# Patient Record
Sex: Male | Born: 1995 | Smoking: Current some day smoker
Health system: Western US, Academic
[De-identification: ages and names within clinical notes are randomized; demographics above are authoritative.]

## PROBLEM LIST (undated history)

## (undated) DIAGNOSIS — T7840XA Allergy, unspecified, initial encounter: Secondary | ICD-10-CM

## (undated) DIAGNOSIS — L209 Atopic dermatitis, unspecified: Secondary | ICD-10-CM

## (undated) DIAGNOSIS — J329 Chronic sinusitis, unspecified: Secondary | ICD-10-CM

## (undated) DIAGNOSIS — L210 Seborrhea capitis: Secondary | ICD-10-CM

## (undated) DIAGNOSIS — D573 Sickle-cell trait: Secondary | ICD-10-CM

## (undated) HISTORY — PX: PR OPEN TX RADIOCARPAL/INTERCARPAL DISLC 1/> BONES: 25670

## (undated) HISTORY — DX: Allergy, unspecified, initial encounter: T78.40XA

---
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
## 2017-11-03 ENCOUNTER — Encounter: Payer: Self-pay | Admitting: Family Medicine

## 2017-11-03 ENCOUNTER — Other Ambulatory Visit (HOSPITAL_COMMUNITY)
Admission: RE | Admit: 2017-11-03 | Discharge: 2017-11-03 | Disposition: A | Discharge status: Home / Self Care | Payer: BLUE CROSS/BLUE SHIELD | Source: Ambulatory Visit | Attending: Family Medicine | Admitting: Family Medicine

## 2017-11-03 ENCOUNTER — Ambulatory Visit (INDEPENDENT_AMBULATORY_CARE_PROVIDER_SITE_OTHER): Payer: Self-pay | Admitting: Family Medicine

## 2017-11-03 VITALS — BP 122/76 | HR 76 | Temp 98.0°F | Resp 16 | Ht 76.5 in | Wt 186.0 lb

## 2017-11-03 DIAGNOSIS — R319 Hematuria, unspecified: Secondary | ICD-10-CM

## 2017-11-03 DIAGNOSIS — R748 Abnormal levels of other serum enzymes: Secondary | ICD-10-CM

## 2017-11-03 LAB — POCT URINALYSIS DIPSTICK
BILIRUBIN UA: NEGATIVE
Glucose, UA: NEGATIVE
Ketones, UA: NEGATIVE
Leukocytes, UA: NEGATIVE
NITRITE UA: NEGATIVE
PH UA: 6 (ref 5.0–8.0)
Protein, UA: NEGATIVE
Spec Grav, UA: >=1.03 — AB (ref 1.010–1.025)
Urobilinogen, UA: 0.2 E.U./dL

## 2017-11-03 NOTE — Progress Notes (Signed)
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  He states that is sees blood in his urine after exercise.  He has had this problem for about 5 months.  His PCP at home told him it was due to dehydration and elevated CK levels.  He was told that he needed to stay well hydrated and he also wanted him to rest for 2 weeks.  The patient reports that he had his college season and then was drafted and has been unable to rest.  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  He is sexually active,monogamous. PMH only for AR. FH unremarkable. Mild gross hematuria since February with no associated discharge or dysuria. Past Medical History:  Diagnosis Date  . Allergy     Social History   Socioeconomic History  . Marital status: Single    Spouse name: Not on file  . Number of children: 0  . Years of education: Not on file  . Highest education level: Some college, no degree  Occupational History  . Occupation: Print production planner    Comment: [employer]  Social Needs  . Financial resource strain: Not on file  . Food insecurity:    Worry: Not on file    Inability: Not on file  . Transportation needs:    Medical: Not on file    Non-medical: Not on file  Tobacco Use  . Smoking status: Never Smoker  Substance and Sexual Activity  . Alcohol use: Yes    Alcohol/week: 1.8 oz    Types: 3 Standard drinks or equivalent per week  . Drug use: Never  . Sexual activity: Yes    Birth control/protection: Condom  Lifestyle  . Physical activity:    Days per week: Not on file    Minutes per session: Not on file  . Stress: Not on file  Relationships  . Social connections:    Talks on phone: Not on file    Gets together: Not on file    Attends religious service: Not on file    Active member of club or organization: Not on file    Attends meetings of  clubs or organizations: Not on file    Relationship status: Not on file  . Intimate partner violence:    Fear of current or ex partner: Not on file    Emotionally abused: Not on file    Physically abused: Not on file    Forced sexual activity: Not on file  Other Topics Concern  . Not on file  Social History Narrative  . Not on file    No outpatient encounter medications on file as of 11/03/2017.   No facility-administered encounter medications on file as of 11/03/2017.     No Known Allergies  Review of Systems  Constitutional: Negative.   HENT: Negative.   Eyes: Negative.   Respiratory: Negative.   Cardiovascular: Negative.   Gastrointestinal: Negative.   Genitourinary: Positive for hematuria.  Musculoskeletal: Negative.   Skin: Negative.   Neurological: Negative.   Endo/Heme/Allergies: Negative.   Psychiatric/Behavioral: Negative.     Objective:  BP 122/76 (BP Location: Right Arm, Patient Position: Sitting, Cuff Size: Normal)   Pulse 76   Temp 98 F (36.7 C) (Oral)   Resp 16   Ht 6' 4.5" (1.943 m)   Wt 186 lb (84.4 kg)  BMI 22.35 kg/m   Physical Exam  Constitutional: He is oriented to person, place, and time and well-developed, well-nourished, and in no distress.  HENT:  Head: Normocephalic and atraumatic.  Eyes: Conjunctivae are normal. No scleral icterus.  Cardiovascular: Normal rate, regular rhythm and normal heart sounds.  Pulmonary/Chest: Effort normal and breath sounds normal.  Abdominal: Soft.  Genitourinary: Penis normal.  Musculoskeletal: He exhibits no edema.  Lymphadenopathy:    He has no cervical adenopathy.  Neurological: He is alert and oriented to person, place, and time. Gait normal. GCS score is 15.  Skin: Skin is warm and dry.  Psychiatric: Mood, memory, affect and judgment normal.    Assessment and Plan :  Hematuria, unspecified type - Plan: POCT urinalysis dipstick Chlamydia/GC/labs. Likely benign but persistant. Refer to  Urology.  I have done the exam and reviewed the chart and it is accurate to the best of my knowledge. DentistDragon  technology has been used and  any errors in dictation or transcription are unintentional. Julieanne Mansonichard Khristian Seals M.D. [facility]

## 2017-11-04 LAB — COMPREHENSIVE METABOLIC PANEL
A/G RATIO: 1.5 (ref 1.2–2.2)
ALK PHOS: 72 IU/L (ref 39–117)
ALT: 12 IU/L (ref 0–44)
AST: 23 IU/L (ref 0–40)
Albumin: 4.3 g/dL (ref 3.5–5.5)
BUN/Creatinine Ratio: 9 (ref 9–20)
BUN: 12 mg/dL (ref 6–20)
Bilirubin Total: 0.3 mg/dL (ref 0.0–1.2)
CO2: 23 mmol/L (ref 20–29)
CREATININE: 1.35 mg/dL — AB (ref 0.76–1.27)
Calcium: 9.1 mg/dL (ref 8.7–10.2)
Chloride: 105 mmol/L (ref 96–106)
GFR calc Af Amer: 87 mL/min/{1.73_m2} (ref >59)
GFR calc non Af Amer: 75 mL/min/{1.73_m2} (ref >59)
GLOBULIN, TOTAL: 2.9 g/dL (ref 1.5–4.5)
Glucose: 61 mg/dL — ABNORMAL LOW (ref 65–99)
POTASSIUM: 4.2 mmol/L (ref 3.5–5.2)
SODIUM: 142 mmol/L (ref 134–144)
Total Protein: 7.2 g/dL (ref 6.0–8.5)

## 2017-11-04 LAB — URINE CYTOLOGY ANCILLARY ONLY
CHLAMYDIA, DNA PROBE: NEGATIVE
NEISSERIA GONORRHEA: NEGATIVE

## 2017-11-04 LAB — CBC WITH DIFFERENTIAL/PLATELET
BASOS ABS: 0.1 10*3/uL (ref 0.0–0.2)
Basos: 2 %
EOS (ABSOLUTE): 0.2 10*3/uL (ref 0.0–0.4)
Eos: 7 %
Hematocrit: 39.8 % (ref 37.5–51.0)
Hemoglobin: 13.5 g/dL (ref 13.0–17.7)
Immature Grans (Abs): 0 10*3/uL (ref 0.0–0.1)
Immature Granulocytes: 0 %
LYMPHS ABS: 1.7 10*3/uL (ref 0.7–3.1)
Lymphs: 49 %
MCH: 25.7 pg — AB (ref 26.6–33.0)
MCHC: 33.9 g/dL (ref 31.5–35.7)
MCV: 76 fL — AB (ref 79–97)
MONOCYTES: 12 %
MONOS ABS: 0.4 10*3/uL (ref 0.1–0.9)
NEUTROS ABS: 1 10*3/uL — AB (ref 1.4–7.0)
Neutrophils: 30 %
PLATELETS: 235 10*3/uL (ref 150–450)
RBC: 5.25 x10E6/uL (ref 4.14–5.80)
RDW: 14.3 % (ref 12.3–15.4)
WBC: 3.3 10*3/uL — AB (ref 3.4–10.8)

## 2017-11-04 LAB — CK: Total CK: 583 U/L (ref 24–204)

## 2017-11-18 ENCOUNTER — Ambulatory Visit (INDEPENDENT_AMBULATORY_CARE_PROVIDER_SITE_OTHER): Payer: BLUE CROSS/BLUE SHIELD

## 2017-11-18 DIAGNOSIS — Z23 Encounter for immunization: Secondary | ICD-10-CM | POA: Diagnosis not present

## 2017-11-27 ENCOUNTER — Ambulatory Visit (INDEPENDENT_AMBULATORY_CARE_PROVIDER_SITE_OTHER): Payer: BLUE CROSS/BLUE SHIELD | Admitting: Family Medicine

## 2017-11-27 VITALS — BP 118/76 | HR 58 | Temp 98.1°F | Resp 16 | Wt 185.0 lb

## 2017-11-27 DIAGNOSIS — G729 Myopathy, unspecified: Secondary | ICD-10-CM

## 2017-11-27 DIAGNOSIS — D649 Anemia, unspecified: Secondary | ICD-10-CM | POA: Diagnosis not present

## 2017-11-27 NOTE — Progress Notes (Signed)
George Wilkinson  MRN: [id number] DOB: 22-Nov-1995  Subjective:  HPI   Patient is a 22 year old male that presents for follow up of his elevated CK He feels well. No c/o. His mother does have sickle cell trait. There are no active problems to display for this patient.   Past Medical History:  Diagnosis Date  . Allergy     Social History   Socioeconomic History  . Marital status: Single    Spouse name: Not on file  . Number of children: 0  . Years of education: Not on file  . Highest education level: Some college, no degree  Occupational History  . Occupation: Print production planner    Comment: [employer]  Social Needs  . Financial resource strain: Not on file  . Food insecurity:    Worry: Not on file    Inability: Not on file  . Transportation needs:    Medical: Not on file    Non-medical: Not on file  Tobacco Use  . Smoking status: Never Smoker  Substance and Sexual Activity  . Alcohol use: Yes    Alcohol/week: 1.8 oz    Types: 3 Standard drinks or equivalent per week  . Drug use: Never  . Sexual activity: Yes    Birth control/protection: Condom  Lifestyle  . Physical activity:    Days per week: Not on file    Minutes per session: Not on file  . Stress: Not on file  Relationships  . Social connections:    Talks on phone: Not on file    Gets together: Not on file    Attends religious service: Not on file    Active member of club or organization: Not on file    Attends meetings of clubs or organizations: Not on file    Relationship status: Not on file  . Intimate partner violence:    Fear of current or ex partner: Not on file    Emotionally abused: Not on file    Physically abused: Not on file    Forced sexual activity: Not on file  Other Topics Concern  . Not on file  Social History Narrative  . Not on file    No outpatient encounter medications on file as of 11/27/2017.   No facility-administered encounter medications on file as of 11/27/2017.      No Known Allergies  Review of Systems  Constitutional: Negative.   Eyes: Negative.   Respiratory: Negative.   Cardiovascular: Negative.   Gastrointestinal: Negative.   Musculoskeletal: Negative for back pain, joint pain, myalgias and neck pain.  Skin: Negative.   Neurological: Negative.   Endo/Heme/Allergies: Negative.   Psychiatric/Behavioral: Negative.     Objective:  BP 118/76 (BP Location: Right Arm, Patient Position: Sitting, Cuff Size: Normal)   Pulse (!) 58   Temp 98.1 F (36.7 C) (Oral)   Resp 16   Wt 185 lb (83.9 kg)   BMI 22.23 kg/m   Physical Exam  Constitutional: He is oriented to person, place, and time.  HENT:  Head: Normocephalic.  Right Ear: External ear normal.  Left Ear: External ear normal.  Nose: Nose normal.  Eyes: Conjunctivae are normal. No scleral icterus.  Neck: No thyromegaly present.  Cardiovascular: Normal rate, regular rhythm and normal heart sounds.  Pulmonary/Chest: Effort normal and breath sounds normal.  Neurological: He is alert and oriented to person, place, and time. Gait normal. GCS score is 15.  Skin: Skin is warm and dry.  Psychiatric: Mood,  memory, affect and judgment normal.    Assessment and Plan :  1. Myopathy  - Iron - CBC with Differential/Platelet - Urinalysis, Routine w reflex microscopic; Future - Urinalysis, Routine w reflex microscopic  2. Anemia, unspecified type  - CK - ANA - Sedimentation rate - Iron - CBC with Differential/Platelet - Aldolase - Urinalysis, Routine w reflex microscopic; Future - Urinalysis, Routine w reflex microscopic  I have done the exam and reviewed the chart and it is accurate to the best of my knowledge. DentistDragon  technology has been used and  any errors in dictation or transcription are unintentional. Julieanne Mansonichard Gaylene Moylan M.D. [facility]

## 2017-11-28 ENCOUNTER — Telehealth: Payer: Self-pay

## 2017-11-28 LAB — URINALYSIS, ROUTINE W REFLEX MICROSCOPIC
BILIRUBIN UA: NEGATIVE
Glucose, UA: NEGATIVE
Ketones, UA: NEGATIVE
Nitrite, UA: NEGATIVE
PH UA: 7 (ref 5.0–7.5)
Specific Gravity, UA: 1.021 (ref 1.005–1.030)
UUROB: 0.2 mg/dL (ref 0.2–1.0)

## 2017-11-28 LAB — CBC WITH DIFFERENTIAL/PLATELET
BASOS: 1 %
Basophils Absolute: 0 10*3/uL (ref 0.0–0.2)
EOS (ABSOLUTE): 0.3 10*3/uL (ref 0.0–0.4)
EOS: 10 %
HEMATOCRIT: 40.4 % (ref 37.5–51.0)
Hemoglobin: 12.7 g/dL — ABNORMAL LOW (ref 13.0–17.7)
IMMATURE GRANS (ABS): 0 10*3/uL (ref 0.0–0.1)
IMMATURE GRANULOCYTES: 0 %
Lymphocytes Absolute: 1.3 10*3/uL (ref 0.7–3.1)
Lymphs: 42 %
MCH: 24.2 pg — AB (ref 26.6–33.0)
MCHC: 31.4 g/dL — ABNORMAL LOW (ref 31.5–35.7)
MCV: 77 fL — AB (ref 79–97)
MONOS ABS: 0.3 10*3/uL (ref 0.1–0.9)
Monocytes: 9 %
NEUTROS ABS: 1.1 10*3/uL — AB (ref 1.4–7.0)
Neutrophils: 38 %
PLATELETS: 257 10*3/uL (ref 150–450)
RBC: 5.25 x10E6/uL (ref 4.14–5.80)
RDW: 12.9 % (ref 12.3–15.4)
WBC: 3 10*3/uL — ABNORMAL LOW (ref 3.4–10.8)

## 2017-11-28 LAB — MICROSCOPIC EXAMINATION
CASTS: NONE SEEN /LPF
EPITHELIAL CELLS (NON RENAL): NONE SEEN /HPF (ref 0–10)
RBC, UA: >30 /hpf — AB (ref 0–2)

## 2017-11-28 LAB — IRON: Iron: 52 ug/dL (ref 38–169)

## 2017-11-28 LAB — ALDOLASE: Aldolase: 8.9 U/L (ref 3.3–10.3)

## 2017-11-28 LAB — ANA: ANA: NEGATIVE

## 2017-11-28 LAB — SEDIMENTATION RATE: SED RATE: 2 mm/h (ref 0–15)

## 2017-11-28 LAB — CK: CK TOTAL: 348 U/L — AB (ref 24–204)

## 2017-11-28 NOTE — Progress Notes (Signed)
Advised of results.  Trainer relayed that the patient was tested in [place] and was positive for infection.  He does not know if he was treated or it the infection was urine, GC or Chlamydia.  He will try to get those results and in the meantime he will plan on coming in on Monday when they get back in town.   [name] was advised to watch for signs that may indication worsening infection such as fever, increased blood in the urine, back pain and abdominal or testicular pain.

## 2017-11-29 NOTE — Telephone Encounter (Signed)
Spoke with trainer regarding lab results.  Will have patient f/u labs when back in town

## 2017-12-01 ENCOUNTER — Ambulatory Visit: Payer: Self-pay | Admitting: Family Medicine

## 2017-12-03 ENCOUNTER — Other Ambulatory Visit: Payer: Self-pay

## 2017-12-03 DIAGNOSIS — R319 Hematuria, unspecified: Secondary | ICD-10-CM

## 2017-12-04 LAB — URINALYSIS, ROUTINE W REFLEX MICROSCOPIC
BILIRUBIN UA: NEGATIVE
Glucose, UA: NEGATIVE
KETONES UA: NEGATIVE
Nitrite, UA: NEGATIVE
PH UA: 5.5 (ref 5.0–7.5)
SPEC GRAV UA: 1.024 (ref 1.005–1.030)
Urobilinogen, Ur: 0.2 mg/dL (ref 0.2–1.0)

## 2017-12-04 LAB — MICROSCOPIC EXAMINATION
CASTS: NONE SEEN /LPF
EPITHELIAL CELLS (NON RENAL): NONE SEEN /HPF (ref 0–10)
RBC, UA: >30 /hpf — AB (ref 0–2)

## 2017-12-04 LAB — GC/CHLAMYDIA PROBE AMP
CHLAMYDIA, DNA PROBE: NEGATIVE
Neisseria gonorrhoeae by PCR: NEGATIVE

## 2017-12-05 ENCOUNTER — Telehealth: Payer: Self-pay

## 2017-12-05 ENCOUNTER — Other Ambulatory Visit: Payer: Self-pay

## 2017-12-05 DIAGNOSIS — R310 Gross hematuria: Secondary | ICD-10-CM

## 2017-12-05 NOTE — Telephone Encounter (Signed)
Advised 

## 2017-12-05 NOTE — Telephone Encounter (Signed)
-----   Message from Maple Hudsonichard L Gilbert Jr., MD sent at 12/04/2017  4:52 PM EDT ----- GC/Chlamydia negative--refer to urology. thanks

## 2017-12-05 NOTE — Telephone Encounter (Signed)
Ref for urology ordered. Will call brad and let him know.  Will call Maralyn SagoSarah and let her know about travel schedule    ----- Message from Maple Hudsonichard L Gilbert Jr., MD sent at 12/04/2017  4:52 PM EDT ----- GC/Chlamydia negative--refer to urology. thanks

## 2018-01-04 ENCOUNTER — Other Ambulatory Visit: Payer: Self-pay | Admitting: Orthopedic Surgery

## 2018-01-04 DIAGNOSIS — S62002P Unspecified fracture of navicular [scaphoid] bone of left wrist, subsequent encounter for fracture with malunion: Secondary | ICD-10-CM

## 2018-01-05 ENCOUNTER — Other Ambulatory Visit: Payer: PRIVATE HEALTH INSURANCE

## 2018-01-06 ENCOUNTER — Other Ambulatory Visit: Payer: Self-pay | Admitting: Orthopedic Surgery

## 2018-01-06 ENCOUNTER — Encounter: Payer: Self-pay | Admitting: Urology

## 2018-01-06 ENCOUNTER — Encounter

## 2018-01-06 ENCOUNTER — Ambulatory Visit: Payer: BLUE CROSS/BLUE SHIELD | Admitting: Urology

## 2018-01-06 DIAGNOSIS — S62002P Unspecified fracture of navicular [scaphoid] bone of left wrist, subsequent encounter for fracture with malunion: Secondary | ICD-10-CM

## 2018-01-07 ENCOUNTER — Encounter: Admit: 2018-01-07 | Discharge: 2018-01-08 | Payer: BC Managed Care – PPO

## 2018-01-07 ENCOUNTER — Ambulatory Visit
Admission: RE | Admit: 2018-01-07 | Discharge: 2018-01-07 | Disposition: A | Discharge status: Home / Self Care | Payer: BLUE CROSS/BLUE SHIELD | Source: Ambulatory Visit | Attending: Orthopedic Surgery | Admitting: Orthopedic Surgery

## 2018-01-07 DIAGNOSIS — S62002P Unspecified fracture of navicular [scaphoid] bone of left wrist, subsequent encounter for fracture with malunion: Secondary | ICD-10-CM

## 2018-01-07 DIAGNOSIS — S62014G Nondisplaced fracture of distal pole of navicular [scaphoid] bone of right wrist, subsequent encounter for fracture with delayed healing: Secondary | ICD-10-CM | POA: Insufficient documentation

## 2018-01-08 ENCOUNTER — Encounter: Admit: 2018-01-08 | Discharge: 2018-01-08 | Payer: BC Managed Care – PPO

## 2018-01-08 ENCOUNTER — Telehealth: Payer: Self-pay | Admitting: Family Medicine

## 2018-01-08 DIAGNOSIS — M25531 Pain in right wrist: Principal | ICD-10-CM

## 2018-01-08 NOTE — Telephone Encounter (Signed)
FYI--Pt was a no show for urology referral.Pt was sent for R31.9 (ICD-10-CM) - Hematuria, unspecified type R74.8 (ICD-10-CM) - Elevated CK

## 2018-01-09 ENCOUNTER — Ambulatory Visit: Admit: 2018-01-09 | Discharge: 2018-01-10 | Payer: BC Managed Care – PPO

## 2018-01-09 ENCOUNTER — Encounter: Admit: 2018-01-09 | Discharge: 2018-01-09 | Payer: BC Managed Care – PPO

## 2018-01-09 ENCOUNTER — Ambulatory Visit: Admit: 2018-01-09 | Discharge: 2018-01-09 | Payer: BC Managed Care – PPO

## 2018-01-09 ENCOUNTER — Ambulatory Visit: Payer: PRIVATE HEALTH INSURANCE

## 2018-01-09 DIAGNOSIS — S62001K Unspecified fracture of navicular [scaphoid] bone of right wrist, subsequent encounter for fracture with nonunion: Principal | ICD-10-CM

## 2018-01-09 DIAGNOSIS — M25531 Pain in right wrist: Principal | ICD-10-CM

## 2018-01-09 NOTE — Telephone Encounter (Signed)
Please review. Thanks!  

## 2018-01-10 DIAGNOSIS — S62001K Unspecified fracture of navicular [scaphoid] bone of right wrist, subsequent encounter for fracture with nonunion: Principal | ICD-10-CM

## 2018-01-12 ENCOUNTER — Ambulatory Visit: Admit: 2018-01-12 | Discharge: 2018-01-12 | Payer: BC Managed Care – PPO

## 2018-01-12 DIAGNOSIS — S62031K Displaced fracture of proximal third of navicular [scaphoid] bone of right wrist, subsequent encounter for fracture with nonunion: Principal | ICD-10-CM

## 2018-01-13 ENCOUNTER — Ambulatory Visit: Admit: 2018-01-13 | Discharge: 2018-01-13 | Payer: BC Managed Care – PPO

## 2018-01-13 ENCOUNTER — Encounter: Admit: 2018-01-13 | Discharge: 2018-01-13 | Payer: BC Managed Care – PPO

## 2018-01-13 DIAGNOSIS — I96 Gangrene, not elsewhere classified: ICD-10-CM

## 2018-01-13 DIAGNOSIS — S62031K Displaced fracture of proximal third of navicular [scaphoid] bone of right wrist, subsequent encounter for fracture with nonunion: Principal | ICD-10-CM

## 2018-01-13 MED ORDER — FENTANYL CITRATE (PF) 50 MCG/ML IJ SOLN
0 refills | Status: DC
Start: 2018-01-13 — End: 2018-01-13
  Administered 2018-01-13 (×2): 50 ug via INTRAVENOUS

## 2018-01-13 MED ORDER — HYDROMORPHONE (PF) 2 MG/ML IJ SYRG
.5 mg | INTRAVENOUS | 0 refills | Status: DC | PRN
Start: 2018-01-13 — End: 2018-01-13
  Administered 2018-01-13 (×3): 0.5 mg via INTRAVENOUS

## 2018-01-13 MED ORDER — METOCLOPRAMIDE HCL 5 MG/ML IJ SOLN
10 mg | Freq: Once | INTRAVENOUS | 0 refills | Status: DC | PRN
Start: 2018-01-13 — End: 2018-01-13

## 2018-01-13 MED ORDER — LIDOCAINE (PF) 10 MG/ML (1 %) IJ SOLN
.1-2 mL | INTRAMUSCULAR | 0 refills | Status: DC | PRN
Start: 2018-01-13 — End: 2018-01-13

## 2018-01-13 MED ORDER — OXYCODONE 5 MG PO TAB
5-10 mg | Freq: Once | ORAL | 0 refills | Status: CP | PRN
Start: 2018-01-13 — End: ?
  Administered 2018-01-13: 17:00:00 10 mg via ORAL

## 2018-01-13 MED ORDER — CEFAZOLIN 1 GRAM IJ SOLR
0 refills | Status: DC
Start: 2018-01-13 — End: 2018-01-13
  Administered 2018-01-13: 13:00:00 2 g via INTRAVENOUS

## 2018-01-13 MED ORDER — DEXMEDETOMIDINE# 4MCG/ML IV SOLN
0 refills | Status: DC
Start: 2018-01-13 — End: 2018-01-13
  Administered 2018-01-13 (×2): 4 ug via INTRAVENOUS

## 2018-01-13 MED ORDER — DEXAMETHASONE SODIUM PHOSPHATE 4 MG/ML IJ SOLN
INTRAVENOUS | 0 refills | Status: DC
Start: 2018-01-13 — End: 2018-01-13
  Administered 2018-01-13: 13:00:00 4 mg via INTRAVENOUS

## 2018-01-13 MED ORDER — FENTANYL CITRATE (PF) 50 MCG/ML IJ SOLN
0 refills | Status: DC
Start: 2018-01-13 — End: 2018-01-13

## 2018-01-13 MED ORDER — ROPIVACAINE (PF) 2 MG/ML (0.2 %) IJ SOLN
0 refills | Status: DC
Start: 2018-01-13 — End: 2018-01-13
  Administered 2018-01-13: 16:00:00 10 mL via INTRAMUSCULAR

## 2018-01-13 MED ORDER — LACTATED RINGERS IV SOLP
INTRAVENOUS | 0 refills | Status: DC
Start: 2018-01-13 — End: 2018-01-13
  Administered 2018-01-13: 16:00:00 1000.000 mL via INTRAVENOUS

## 2018-01-13 MED ORDER — MIDAZOLAM 1 MG/ML IJ SOLN
INTRAVENOUS | 0 refills | Status: DC
Start: 2018-01-13 — End: 2018-01-13
  Administered 2018-01-13: 13:00:00 2 mg via INTRAVENOUS

## 2018-01-13 MED ORDER — LIDOCAINE (PF) 200 MG/10 ML (2 %) IJ SYRG
0 refills | Status: DC
Start: 2018-01-13 — End: 2018-01-13
  Administered 2018-01-13: 13:00:00 40 mg via INTRAVENOUS

## 2018-01-13 MED ORDER — DEXTRAN 70-HYPROMELLOSE (PF) 0.1-0.3 % OP DPET
0 refills | Status: DC
Start: 2018-01-13 — End: 2018-01-13
  Administered 2018-01-13: 13:00:00 2 [drp] via OPHTHALMIC

## 2018-01-13 MED ORDER — GABAPENTIN 300 MG PO CAP
300 mg | Freq: Once | ORAL | 0 refills | Status: CP
Start: 2018-01-13 — End: ?
  Administered 2018-01-13: 12:00:00 300 mg via ORAL

## 2018-01-13 MED ORDER — ACETAMINOPHEN 500 MG PO TAB
1000 mg | Freq: Once | ORAL | 0 refills | Status: CP
Start: 2018-01-13 — End: ?
  Administered 2018-01-13: 12:00:00 1000 mg via ORAL

## 2018-01-13 MED ORDER — PROMETHAZINE 25 MG/ML IJ SOLN
6.25 mg | INTRAVENOUS | 0 refills | Status: DC | PRN
Start: 2018-01-13 — End: 2018-01-13

## 2018-01-13 MED ORDER — HYDROCODONE-ACETAMINOPHEN 5-325 MG PO TAB
1-2 | ORAL_TABLET | ORAL | 0 refills | 30.00000 days | Status: AC | PRN
Start: 2018-01-13 — End: ?
  Filled 2018-01-13 (×2): qty 30, 3d supply, fill #1

## 2018-01-13 MED ORDER — ONDANSETRON HCL (PF) 4 MG/2 ML IJ SOLN
INTRAVENOUS | 0 refills | Status: DC
Start: 2018-01-13 — End: 2018-01-13
  Administered 2018-01-13: 15:00:00 4 mg via INTRAVENOUS

## 2018-01-13 MED ORDER — FENTANYL CITRATE (PF) 50 MCG/ML IJ SOLN
50 ug | INTRAVENOUS | 0 refills | Status: DC | PRN
Start: 2018-01-13 — End: 2018-01-13
  Administered 2018-01-13: 17:00:00 50 ug via INTRAVENOUS

## 2018-01-13 MED ORDER — FENTANYL CITRATE (PF) 50 MCG/ML IJ SOLN
50 ug | INTRAVENOUS | 0 refills | Status: DC | PRN
Start: 2018-01-13 — End: 2018-01-13

## 2018-01-13 MED ORDER — HALOPERIDOL LACTATE 5 MG/ML IJ SOLN
1 mg | Freq: Once | INTRAVENOUS | 0 refills | Status: DC | PRN
Start: 2018-01-13 — End: 2018-01-13

## 2018-01-13 MED ORDER — FENTANYL CITRATE (PF) 50 MCG/ML IJ SOLN
25 ug | INTRAVENOUS | 0 refills | Status: DC | PRN
Start: 2018-01-13 — End: 2018-01-13
  Administered 2018-01-13 (×2): 25 ug via INTRAVENOUS

## 2018-01-13 MED ORDER — PROPOFOL INJ 10 MG/ML IV VIAL
0 refills | Status: DC
Start: 2018-01-13 — End: 2018-01-13

## 2018-01-13 MED ADMIN — LACTATED RINGERS IV SOLP [4318]: INTRAVENOUS | @ 12:00:00 | Stop: 2018-01-13 | NDC 00338011704

## 2018-01-15 ENCOUNTER — Encounter: Admit: 2018-01-15 | Discharge: 2018-01-15 | Payer: BC Managed Care – PPO

## 2018-01-29 ENCOUNTER — Ambulatory Visit: Admit: 2018-01-28 | Discharge: 2018-01-29 | Payer: Worker's Compensation

## 2018-01-29 DIAGNOSIS — S62001K Unspecified fracture of navicular [scaphoid] bone of right wrist, subsequent encounter for fracture with nonunion: Principal | ICD-10-CM

## 2018-02-24 ENCOUNTER — Encounter: Admit: 2018-02-24 | Discharge: 2018-02-24 | Payer: BC Managed Care – PPO

## 2018-02-24 DIAGNOSIS — Z09 Encounter for follow-up examination after completed treatment for conditions other than malignant neoplasm: Principal | ICD-10-CM

## 2018-03-04 ENCOUNTER — Ambulatory Visit: Admit: 2018-03-04 | Discharge: 2018-03-04 | Payer: BC Managed Care – PPO

## 2018-03-04 ENCOUNTER — Encounter: Admit: 2018-03-04 | Discharge: 2018-03-04 | Payer: BC Managed Care – PPO

## 2018-03-04 DIAGNOSIS — Z09 Encounter for follow-up examination after completed treatment for conditions other than malignant neoplasm: Principal | ICD-10-CM

## 2018-03-04 DIAGNOSIS — S62001K Unspecified fracture of navicular [scaphoid] bone of right wrist, subsequent encounter for fracture with nonunion: ICD-10-CM

## 2018-03-30 ENCOUNTER — Encounter: Admit: 2018-03-30 | Discharge: 2018-03-30 | Payer: BC Managed Care – PPO

## 2018-03-30 DIAGNOSIS — Z09 Encounter for follow-up examination after completed treatment for conditions other than malignant neoplasm: Principal | ICD-10-CM

## 2018-04-08 ENCOUNTER — Ambulatory Visit: Admit: 2018-04-08 | Discharge: 2018-04-09 | Payer: Worker's Compensation

## 2018-04-08 ENCOUNTER — Encounter: Admit: 2018-04-08 | Discharge: 2018-04-08 | Payer: BC Managed Care – PPO

## 2018-04-08 ENCOUNTER — Ambulatory Visit: Admit: 2018-04-08 | Discharge: 2018-04-08 | Payer: Worker's Compensation

## 2018-04-08 ENCOUNTER — Encounter: Admit: 2018-04-08 | Discharge: 2018-05-05 | Payer: BC Managed Care – PPO

## 2018-04-08 DIAGNOSIS — Z09 Encounter for follow-up examination after completed treatment for conditions other than malignant neoplasm: Principal | ICD-10-CM

## 2018-04-08 DIAGNOSIS — S62001K Unspecified fracture of navicular [scaphoid] bone of right wrist, subsequent encounter for fracture with nonunion: ICD-10-CM

## 2018-04-09 DIAGNOSIS — S62001D Unspecified fracture of navicular [scaphoid] bone of right wrist, subsequent encounter for fracture with routine healing: ICD-10-CM

## 2018-05-05 DIAGNOSIS — Z09 Encounter for follow-up examination after completed treatment for conditions other than malignant neoplasm: Principal | ICD-10-CM

## 2018-05-05 DIAGNOSIS — S62001D Unspecified fracture of navicular [scaphoid] bone of right wrist, subsequent encounter for fracture with routine healing: ICD-10-CM

## 2018-05-15 ENCOUNTER — Encounter: Admit: 2018-05-15 | Discharge: 2018-05-15 | Payer: BC Managed Care – PPO

## 2018-05-15 DIAGNOSIS — Z021 Encounter for pre-employment examination: Secondary | ICD-10-CM

## 2018-05-20 ENCOUNTER — Ambulatory Visit: Admit: 2018-05-20 | Discharge: 2018-05-20 | Payer: BC Managed Care – PPO

## 2018-05-20 DIAGNOSIS — S62001K Unspecified fracture of navicular [scaphoid] bone of right wrist, subsequent encounter for fracture with nonunion: Secondary | ICD-10-CM

## 2018-05-20 DIAGNOSIS — S62001D Unspecified fracture of navicular [scaphoid] bone of right wrist, subsequent encounter for fracture with routine healing: Secondary | ICD-10-CM

## 2018-05-20 DIAGNOSIS — Z09 Encounter for follow-up examination after completed treatment for conditions other than malignant neoplasm: Secondary | ICD-10-CM

## 2018-05-27 ENCOUNTER — Encounter: Admit: 2018-05-27 | Discharge: 2018-05-27 | Payer: Worker's Compensation

## 2018-05-27 DIAGNOSIS — Z021 Encounter for pre-employment examination: Secondary | ICD-10-CM

## 2018-05-27 LAB — CBC
Lab: 11 g/dL — ABNORMAL LOW (ref 13.5–16.5)
Lab: 16 % — ABNORMAL HIGH (ref >60)
Lab: 19 pg — ABNORMAL LOW (ref 26–34)
Lab: 3.9 K/UL — ABNORMAL LOW (ref 4.5–11.0)
Lab: 31 g/dL — ABNORMAL LOW (ref >60)
Lab: 35 % — ABNORMAL LOW (ref 40–50)
Lab: 440 K/UL — ABNORMAL HIGH (ref 150–400)
Lab: 5.6 M/UL — ABNORMAL HIGH (ref 4.4–5.5)
Lab: 62 FL — ABNORMAL LOW (ref 80–100)
Lab: 9.3 FL (ref 7–11)

## 2018-05-27 LAB — COMPREHENSIVE METABOLIC PANEL
Lab: 10 U/L (ref 7–56)
Lab: 139 MMOL/L (ref 137–147)
Lab: 25 MMOL/L (ref 21–30)
Lab: 4.3 MMOL/L (ref 3.5–5.1)
Lab: >60 mL/min (ref >60)
Lab: >60 mL/min (ref >60)
Lab: 11 (ref 3–12)

## 2018-06-08 ENCOUNTER — Encounter: Admit: 2018-06-08 | Discharge: 2018-06-08 | Payer: BC Managed Care – PPO

## 2018-06-08 DIAGNOSIS — Z09 Encounter for follow-up examination after completed treatment for conditions other than malignant neoplasm: Principal | ICD-10-CM

## 2018-06-17 ENCOUNTER — Ambulatory Visit: Admit: 2018-06-17 | Discharge: 2018-06-17 | Payer: BC Managed Care – PPO

## 2018-06-17 ENCOUNTER — Encounter: Admit: 2018-06-17 | Discharge: 2018-06-17 | Payer: BC Managed Care – PPO

## 2018-06-17 DIAGNOSIS — Z09 Encounter for follow-up examination after completed treatment for conditions other than malignant neoplasm: Principal | ICD-10-CM

## 2018-07-17 ENCOUNTER — Encounter: Admit: 2018-07-17 | Discharge: 2018-07-17 | Payer: BC Managed Care – PPO

## 2018-07-17 DIAGNOSIS — Z09 Encounter for follow-up examination after completed treatment for conditions other than malignant neoplasm: Principal | ICD-10-CM

## 2018-07-22 ENCOUNTER — Encounter: Admit: 2018-07-22 | Discharge: 2018-07-22 | Payer: BC Managed Care – PPO

## 2018-09-15 ENCOUNTER — Other Ambulatory Visit (HOSPITAL_BASED_OUTPATIENT_CLINIC_OR_DEPARTMENT_OTHER): Payer: Self-pay | Admitting: Internal Medicine

## 2018-09-15 DIAGNOSIS — I701 Atherosclerosis of renal artery: Secondary | ICD-10-CM

## 2018-10-21 ENCOUNTER — Encounter: Admit: 2018-10-21 | Discharge: 2018-10-21

## 2018-10-23 NOTE — Progress Notes
REVIEW OF CT SCAN:  The patient had his CT scan.  The CT scan shows that there continues to be this defect on the volar aspect.  This does not appear to have changed any.  The screw does not appear to be loose.  There appears to be some healing further distally.  He has been hitting for a long time according to the trainers.  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  The revascularization will occur with creeping substitution.  This could take a very, very long time.  Nevertheless, the screw seems to be holding things together.  There is certainly bridging of bone further distal.  I would not recommend additional surgeries.           Dictated by Herbert Deaner, MD and transcribed via [employer] Trans[employer]. Copied and pasted into O2 by Junius Argyle, 10/23/2018 8:53 AM

## 2018-10-27 LAB — OUTSIDE PATHOLOGY CONSULT EXTERNAL: CLINICAL INFORMATION: 21

## 2019-02-04 ENCOUNTER — Ambulatory Visit: Payer: No Typology Code available for payment source | Admitting: Family

## 2019-02-12 ENCOUNTER — Ambulatory Visit: Payer: No Typology Code available for payment source | Admitting: Family

## 2019-02-12 ENCOUNTER — Encounter: Payer: Self-pay | Admitting: Family

## 2019-02-12 DIAGNOSIS — R310 Gross hematuria: Secondary | ICD-10-CM

## 2019-02-12 HISTORY — DX: Atopic dermatitis, unspecified: L20.9

## 2019-02-12 HISTORY — DX: Seborrhea capitis: L21.0

## 2019-02-12 HISTORY — DX: Sickle-cell trait: D57.3

## 2019-02-12 HISTORY — DX: Chronic sinusitis, unspecified: J32.9

## 2019-02-12 NOTE — Patient Instructions (Signed)
Darryle Utsey it was a pleasure seeing you for a Video Visit today!      If you have any questions or concerns regarding your visit today please do not hesitate to call or communicate on MyChart.     Mikell Kazlauskas  Nurse Practitioner    [facility]   [phone]

## 2019-02-12 NOTE — Progress Notes (Addendum)
Office Visit     02/12/2019  Urology Clinic       Lavada Mesi, NP     UROLOGY CLINIC 02/12/2019   Donneta Romberg  DOB: 24-Jun-1995  MRN: [id number]  Date of Encounter: 02/12/2019         REFERRING PROVIDER: Zenda Alpers  PCP: Zenda Alpers     Progress Notes     Expand All Collapse All           UROLOGY CLINIC   VIDEO VISIT     I performed this clinical encounter by utilizing a real time telehealth video connection between my location and the patient's location. The patient's location was confirmed during this visit. I obtained verbal consent from the patient to perform this clinical encounter utilizing video and prepared the patient by answering any questions they had about the telehealth interaction.        CHIEF COMPLAINT           Chief Complaint   Patient presents today with c/o visible blood in the urine.           HISTORY OF PRESENT ILLNESS     History was obtained from the patient     Airam Heidecker is a 23 yr old male who presents today as a New Patient for a consult regarding Hematuria.   He reported that he has been very healthy, medical history and surgical history entered in EMR.    He reported having Gross hematuria  in Feb 2019. He has intermittent hematuria since then that he described as 1-2 episodes per week.  He denied history of renal/bladder calculi.  Denied history of trauma.  Denied history of STI's.  He is currently followed by nephrologist Dr.  Allena Katz, [facility]  and had a recent negative renal biopsy.  CT performed was grossly unremarkable.    Cystoscopy August 2020    No recent DRE.       Marland Kitchen  Additional history as it relates to hematuria work-up:   Age: not a risk factor   Gender: male   Smoking history: vapors 2 x per week, no history of daily tobacco use.   Degree of Microhematuria:   Denied irritative LUTS: feels normal force in His voiding stream, denied urinary frequency, denied urgency, denied nocturia     Positive history of Gross Hematuria   Denied Family history of Urothelial CA   Denied  Family history of Lynch Syndrome  Denied Occupational exposures to benzene chemicals or aromatic amines (I.e. rubber, petrochemicals, dyes)      Denied   History of chemotherapy  Denied History of Radiation    No history of exposure to chemicals.       Presenting symptoms today: intermittent fatigue.     All other systems were reviewed and are negative          PAST MEDICAL HISTORY  & PAST SURGICAL HISTORY          Past Medical History:   Diagnosis Date    Atopic dermatitis     Chronic sinusitis     Pityriasis     Sickle cell trait (HCC)      Past Surgical History:   Procedure Laterality Date    NASAL POLYPECTOMY  2012    PR OPEN REPAIR WRIST DISLOCATION             LABS/IMAGING/STUDIES     I have personally reviewed recent results in the medical record.   The following results were  discussed with the patient:     URINALYSIS MICROSCOPIC  Component Name  12/03/2017 12/03/2017 11/27/2017 11/27/2017 11/03/2017     6 - 10 (A)  6 - 10 (A)    3+ (A) >30 (A) 3+ (A) >30 (A)     None seen  None seen     None seen  None seen     Present       Few  Few     See below:  See below:           URINE CHEMISTRY  Component Name  12/03/2017 11/27/2017    0.2 0.2   Urobilinogen, Ur        CBC w/Diff WBC 3.3 K/uL  4.3 - 11.0 09/15/2018 L  [facility]   CBC w/Diff RBC 5.74 million/uL  4.40 - 5.90 09/15/2018          CT Angio Abdomen wo+w Con Reason For Exam  hematuria  EXAM: CT Angio Abdomen wo+w Con  EXAM DATE: 12/22/2018 10:00 AM PDT  COMPARISON: CT 07/11/2017   INDICATION: Hematuria, slowly rising creatinine, assess for vascular malformation  TECHNIQUE: Helical images were obtained through the abdomen before and following administration of IV contrast with postcontrast imaging obtained during the arterial phase. 3-D reconstruction of the vasculature was performed.   IV Contrast: Administered 77.0 ml of ISOVUE 370.00 mg/ml   Radiation dose: CTDI (mGy): DLP (mGy/cm) 10.00,7.90:289.40  FINDINGS:  Lower  thorax: Unremarkable.  Hepatobiliary: Mild hepatomegaly with a right lobe craniocaudal span of 18 cm. No focal hepatic abnormality. There is no biliary dilation. The gallbladder is unremarkable.  Pancreas: Unremarkable.  Spleen: Mild splenomegaly with a craniocaudal span of 13 cm.   Adrenals: Normal.  Kidneys: No focal renal parenchymal abnormality. No evidence of mass, vascular malformation, or pseudoaneurysm. No hydronephrosis or proximal hydroureter.   GI Tract: No bowel distention or wall thickening.  Lymph Nodes: No enlarged lymph nodes.  Vasculature: Nonaneurysmal abdominal aorta. No intimal dissection flap. The celiac trunk, SMA, bilateral renal arteries, and IMA are patent. The common iliac arteries and proximal aspects of the internal and external iliac arteries are patent bilaterally. Accessory renal arteries are present bilaterally.  Peritoneum/retroperitoneum: No free fluid or free air. No retroperitoneal hematoma.  Bones and soft tissues: No suspicious abnormality.  IMPRESSION:  1. Normal kidneys. No evidence of mass or vascular malformation.  2. Mild hepatosplenomegaly.  * * * F I N A L * * *  Dictated by: Jorene Minors MD, Medical Doctor  Electronically signed by: Jorene Minors MD  Transcribed by:MP , D: 12/22/2018 13:10, T: 12/22/2018 13:10, S: 12/22/2018 13:10  * * * F I N A L * * *  12/22/2018       CT Guided Ndl Plc Bx/Asp/Inj Exam: CT guided left renal biopsy 09/15/2018.  Clinical indication: Proteinuria.  Technique: Informed, written consent was given from the patient following complete description of the risks, benefits and alternatives to the procedure.  The patient was brought to the CT suite and placed in a prone position. Pre-procedure imaging was performed through the region of the kidneys. A site overlying the lower pole of the left kidney was marked. This site was then widely prepped and draped usual sterile fashion. 1% lidocaine local anesthetic was administered and with needle in  place re-imaging was performed verifying location. Under CT guidance a 19 gauge introducer for the 18-gauge coring needle was advanced to the outer margin of the lower pole of the left kidney.  With the pathologist present 3 core samples were acquired which were deemed sufficient. The stylet was then placed and the needle was removed. A bandage was placed at the puncture site.  0.15, 10.72, 10.84, 10.84, 10.84, 10.80, 10.99, 10.99, 10.99, 10.99, 11.03, 11.03: 1478  The procedure was without complication and was very well tolerate by the patient. The patient left radiology department in stable condition without complaint.  Impression: Uncomplicated CT guided left kidney biopsy with 3 core samples acquired which were deemed sufficient by the pathologist. Procedure was very well tolerated by the patient.  * * * F I N A L * * *  Dictated by: Batiste, Stanley M MD  ElectronicalDolores Hoosely signed by: Dolores HooseBatiste, Stanley M MD  Transcribed by:SP , D: 09/15/2018 15:53, T: 09/15/2018 15:53, S: 09/15/2018 15:54  * * * F I N A L * * *     CT Abdomen+Pelvis wo+w IV Con Reason For Exam  hematuria  Subject: CT urogram   Please order CT urogram for dx hematuria  Procedure: CT Abdomen+Pelvis wo+w IV Con  Exam: 07/11/2017 2:11 PM PST  Indication for study: hematuria  RADIATION DOSE: CTDI Vol (mGy):DLP (mGy-cm) 7.45 7.30 742.62  Comparison: None.  IV Contrast: 125 mL of Isovue-370  Procedure: Axial 2.5 mm slices through the abdomen and pelvis were acquired without contrast. Contrast CT performed of the abdomen and pelvis utilizing 2 phase dose urogram protocol.Thin axial, coronal and sagittal and MIP reformats were created and reviewed.  FINDINGS:  Lack of fat slightly limits evaluation.  CT urogram:    Kidneys: No renal stones are present. No hydronephrosis, renal mass or delayed nephrograms. Normal excretion of contrast is present.  Ureters: No hydroureter.  Bladder: No bladder masses or stones.  Lungs:  The visualized portions of the lung bases  are unremarkable.    Abdomen:  Organs:   The liver, gallbladder, bile ducts, spleen, kidneys, adrenal glands and pancreas are unremarkable.  Bowel: No bowel obstruction, colitis, acute appendicitis, diverticulitis or hiatal hernia. The appendix is normal in size seen on axial delayed 5:148. What is presumed nondistended bowel is seen in the left upper, left lower quadrant and retroperitoneum. This could less likely reflect adenopathy. Lack of peritoneal fat and timing of contrast slightly limits evaluation.  Nodes: No significant pelvic or mesenteric adenopathy.   Mesentery:There is no pneumoperitoneum, free fluid or walled off fluid collection,  Vessels: The abdominal aorta does not appear dilated. There is no significant aortic calcification.    Pelvis:  Reproductive organs: Prostate is unremarkable.  Soft tissues: No anterior abdominal wall hernia or fluid collection.    Osseous structures: The visualized vertebral bodies are of normal height and the intervertebral joint spaces are maintained.   IMPRESSION:  1. Negative CT urogram to explain the hematuria. No renal calculi, hydroureter nephrosis, hydroureter or bladder stones.  * * * F I N A L * * *  Dictated by: Elwyn ReachMcLellan, Anne Marie DO, DO / NS -  Electronically signed by: Elwyn ReachMcLellan, Anne Marie D  Transcribed by:MP , D: 07/11/2017 15:14, T: 07/11/2017 15:14, S: 07/11/2017 15:14  * * * F I N A L * * *                PHYSICAL EXAM     General Appearance: healthy, alert, no distress  Psych- calm, pleasant affect, cooperative, speech is normal  Respiratory- The patient's effort is normal. No visible distress   Skin- no pallor     ASSESSMENT AND  PLAN     GROSS HEMATURIA  We thoroughly discussed the current guidelines in regards to work-up of gross hematuria.  A cystoscopy was performed by an outside urologist and results were negative.  He had a CT urogram March 2019. I have reviewed and reports as noted above.  I am in agreement that there is no obvious pathology.    He has been seeing a nephrologist Dr. Posey Pronto. He actually underwent a renal biopsy Sep 15, 2018. 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  He does not have a history of urethral stricture and currently does not have any obstructive signs.  However, I recommended that Granvil come to the urology clinic for follow-up GU exam, uroflow and PVR for further evaluation.      FOLLOW-UP: As scheduled with me in the urology clinic on 02/17/2019.      Approximately  15 minutes were spent with the patient, of which more than 50% of the time was spent in counseling and/or coordinating care regarding gross hematuria.  The patient verbalized understanding and agreed with the plan of care as outlined.     This note was created using the support of Dragon Medical 10.1.  Please note any grammatical, sound-alike, or syntax errors as likely dictation errors.        Thank you for allowing me to participate in Andris's care.  If you have any questions or concerns, please do not hesitate to call.      Electronically Signed:    Cecilie Kicks, [place]  Urology Clinic  PI# [phone]

## 2019-02-16 ENCOUNTER — Ambulatory Visit: Payer: No Typology Code available for payment source | Attending: Family

## 2019-02-16 DIAGNOSIS — R310 Gross hematuria: Secondary | ICD-10-CM | POA: Insufficient documentation

## 2019-02-16 LAB — URINALYSIS AND CULTURE IF IND
Bilirubin Urine: NEGATIVE
Glucose Urine: NEGATIVE mg/dL
Ketones: NEGATIVE mg/dL
Leuk Esterase: NEGATIVE
Nitrite Urine: NEGATIVE
Protein Urine: 30 mg/dL — AB
RBC: 439 /HPF — ABNORMAL HIGH (ref 0–5)
Specific Gravity, Urine: 1.009 (ref 1.002–1.030)
Squamous EPI: 1 /[HPF] (ref 0–5)
Sulfosal: POSITIVE — AB
Urobilinogen: NEGATIVE mg/dL (ref ?–2.0)
WBC, Urine: 3 /[HPF] (ref 0–5)
pH URINE: 8 (ref 4.8–7.8)

## 2019-02-16 LAB — BASIC METABOLIC PANEL
Calcium: 9.4 mg/dL (ref 8.6–10.5)
Carbon Dioxide Total: 24 mmol/L (ref 24–32)
Chloride: 105 mmol/L (ref 95–110)
Creatinine Serum: 1.19 mg/dL (ref 0.44–1.27)
E-GFR Creatinine (Male): 87 mL/min/{1.73_m2}
E-GFR, African American (Male): 100 mL/min/{1.73_m2}
Glucose: 90 mg/dL (ref 70–99)
Potassium: 4.3 mmol/L (ref 3.3–5.0)
Sodium: 139 mmol/L (ref 135–145)
Urea Nitrogen, Blood (BUN): 12 mg/dL (ref 8–22)

## 2019-02-17 ENCOUNTER — Ambulatory Visit: Payer: No Typology Code available for payment source | Attending: Family | Admitting: Family

## 2019-02-17 ENCOUNTER — Encounter: Payer: Self-pay | Admitting: Family

## 2019-02-17 ENCOUNTER — Ambulatory Visit: Payer: No Typology Code available for payment source

## 2019-02-17 VITALS — BP 129/78 | HR 72 | Temp 98.2°F | Resp 10

## 2019-02-17 DIAGNOSIS — R310 Gross hematuria: Secondary | ICD-10-CM | POA: Insufficient documentation

## 2019-02-17 LAB — POC UROFLOW COMPLEX

## 2019-02-17 LAB — CBC NO DIFFERENTIAL
Hematocrit: 31.2 % — ABNORMAL LOW (ref 40.0–52.0)
Hemoglobin: 9.9 g/dL — ABNORMAL LOW (ref 14.0–18.0)
MCH: 17.5 pg — ABNORMAL LOW (ref 27.0–33.0)
MCHC: 31.6 % — ABNORMAL LOW (ref 32.0–36.0)
MCV: 55.5 fL — ABNORMAL LOW (ref 80.0–100.0)
MPV: 9.1 fL (ref 6.8–10.0)
Platelet Count: 246 10*3/uL (ref 130–400)
Platelet Estimate, Smear: ADEQUATE
RDW: 20.5 % — ABNORMAL HIGH (ref 0.0–14.7)
Red Blood Cell Count: 5.63 10*6/uL (ref 4.50–5.90)
White Blood Cell Count: 3 10*3/uL — ABNORMAL LOW (ref 4.5–11.0)

## 2019-02-17 LAB — POC BLADDER SCAN; US FOR RESIDUAL

## 2019-02-17 NOTE — Nursing Note (Addendum)
Patient verified by (2) identifiers, patient was wearing a surgical mask, vitals taken, screened for pain, allergies verified and pharmacy confirmed. Droplet and Airborne precautions were followed when caring for the patient.     PPE used by during encounter: Surgical mask and Face Shield/Goggles    PVR ordered and completed, with result of 36mL. Patient PVR well. NP is aware of result.    Uroflow ordered and completed     Vincenza Hews, LVN

## 2019-02-17 NOTE — Patient Instructions (Signed)
Alexander Hale it was a pleasure seeing you in the Urology clinic today!     your Uroflow and bladder scan was normal   I recommend full 2 part workup of blood in your urine:   An order has been placed for you to complete imaging with the Radiology Department. Please call Radiology Outpatient Scheduling at [phone] to schedule your appointment.   Return to urology clinic as scheduled for Cystoscopy.      If you have any questions or concerns regarding your visit today please do not hesitate to call or communicate on MyChart.     [name]  Nurse Practitioner    [facility]   [phone]      What You Should Know About Cystoscopy      Your urologist has asked you to schedule a test called Cystoscopy.  This test entails inserting a small scope into the urethra (the tube urine comes out of) in order to be able to visualize the structure and anatomy of the urethra and the bladder.    You will first be asked to void (empty out your bladder), and then you will be catheterized to drain any residual urine in your bladder.  The scope (camera) will then be inserted into the urethra after a small amount of local anesthetic jelly is placed into the urethra.  Usually, the scope will be attached to a video camera so you can visualize the inside of your bladder on a monitor while your urologist is examining it.  the test usually takes approximately 10 minutes. Antibiotics are usually not prescribed as they are not routinely indicated.     You will have burning with urination for few hours after the test.  This should resolve on its own.     Why is there a need to undergo Cystoscopy?  Cystoscopy allows your doctor to examine the inside of your bladder.  It is very important to evaluate the structure of the urethra if you have stress incontinence or urethral irritation or pain.  It is important to examine the bladder if you have urge incontinence, bladder pain, blood in your urine, or other abnormalities which  require examination of the inside of the bladder.  Cystoscopy is the only reliable test for identification of bladder stones, tumors, and other abnormalities within the bladder such as a stitch from a previous surgery, a fistula, or other abnormalities which may be responsible for your symptoms.      * This information is for educational purposes only and should not be relied upon as medical advice.  It has not been designed to replace a physician's independent judgment about the appropriateness or risks of a procedure for a given patient.

## 2019-02-17 NOTE — Progress Notes (Signed)
Office Visit     02/17/2019  Urology Clinic       Lavada Mesi, NP     UROLOGY CLINIC 02/17/2019   Alexander Hale  DOB: Apr 26, 1996  MRN: [id number]  Date of Encounter: 02/17/2019         REFERRING PROVIDER: Lorine Bears*  PCP: Zenda Alpers     Progress Notes     Expand All Collapse All           UROLOGY CLINIC      CHIEF COMPLAINT           Chief Complaint   Patient presents today with c/o visible blood in urine.Marland Kitchen         HISTORY OF PRESENT ILLNESS    History was obtained from the patient, with mother via phone per patient request.    Alexander Hale is a 23yr male who was established with me during Telemedicine Visit on 02/12/2019 that was a consult for asymptomatic gross hematuria.    He reported having Gross hematuria  in Feb 2019. He has intermittent hematuria since then that he described as 1-2 episodes per week.  He denied history of renal/bladder calculi.  Denied history of trauma.  Denied history of STI's.  He is currently followed by nephrologist Dr.  Allena Katz, [facility]  and had a recent negative renal biopsy.  CT of the abdomen with and without contrast performed at outside hospital on 12/22/2018 confirmed normal kidneys, no evidence of mass, no stones, no hydronephrosis, no hydroureter.  Of note he had a prior CTU performed at an outside hospital on 07/11/2017 which was also unremarkable.  Cystoscopy August 2020.   He denied recent DRE.  Tevion presents today for a clinic visit for further discussion.  I recommend that he repeat microscopic urinalysis for confirmation of number of urine RBCs as his prior UA was by dipstick.  UA performed on 02/16/2019 confirmed large occult blood and 439 urine RBCs.   He had a routine cystoscopy for evaluation of gross hematuria performed by an outside urologist with negative results.  A CTU was performed in March 2019 at [facility] and also was negative for a definitive cause to explain hematuria.  Calculi, no hydroureter/hydronephrosis, no bladder stones.      Uroflow  Voided  volume:  739  mL   Qmax: 12.6 cc/sec  PVR: 71 mL            PAST MEDICAL HISTORY  & PAST SURGICAL HISTORY    There is no problem list on file for this patient.  ]  Past Medical History:   Diagnosis Date    Atopic dermatitis     Chronic sinusitis     Pityriasis     Sickle cell trait (HCC)      Past Surgical History:   Procedure Laterality Date    NASAL POLYPECTOMY  2012    PR OPEN REPAIR WRIST DISLOCATION         SOCIAL  & FAMILY HISTORY      Social History     Socioeconomic History    Marital status: Not on file     Spouse name: Not on file    Number of children: Not on file    Years of education: Not on file    Highest education level: Not on file   Occupational History    Not on file   Social Needs    Financial resource strain: Not on file    Food insecurity  Worry: Not on file     Inability: Not on file    Transportation needs     Medical: Not on file     Non-medical: Not on file   Tobacco Use    Smoking status: Current Some Day Smoker     Types: Vapor Cigarettes    Smokeless tobacco: Never Used   Substance and Sexual Activity    Alcohol use: Yes     Frequency: Monthly or less    Drug use: Never    Sexual activity: Not on file   Lifestyle    Physical activity     Days per week: Not on file     Minutes per session: Not on file    Stress: Not on file   Relationships    Social connections     Talks on phone: Not on file     Gets together: Not on file     Attends religious service: Not on file     Active member of club or organization: Not on file     Attends meetings of clubs or organizations: Not on file     Relationship status: Not on file    Intimate partner violence     Fear of current or ex partner: Not on file     Emotionally abused: Not on file     Physically abused: Not on file     Forced sexual activity: Not on file   Other Topics Concern    Not on file   Social History Narrative    Not on file     Family History   Problem Relation Name Age of Onset    Hypertension Father       Diabetes type 2 Mother              MEDICATIONS      No current outpatient medications on file.         LABS/IMAGING/STUDIES     I have personally reviewed recent results in the medical record.   The following results were discussed with the patient:     PVR 02/17/2019 :  71 ml by non imaging ultrasound      URINALYSIS MICROSCOPIC  Component Name              12/03/2017 12/03/2017 11/27/2017 11/27/2017 11/03/2017     6 - 10 (A)  6 - 10 (A)      3+ (A) >30 (A) 3+ (A) >30 (A)       None seen  None seen       None seen  None seen       Present         Few  Few       See below:  See below:               URINE CHEMISTRY  Component Name          12/03/2017 11/27/2017    0.2 0.2      Urobilinogen, Ur          CBC w/Diff WBC 3.3 K/uL  4.3 - 11.0 09/15/2018 L  Alexander Hale   CBC w/Diff RBC 5.74 million/uL  4.40 - 5.90 09/15/2018          CT Angio Abdomen wo+w Con Reason For Exam  hematuria  EXAM: CT Angio Abdomen wo+w Con  EXAM DATE: 12/22/2018 10:00 AM PDT  COMPARISON: CT 07/11/2017   INDICATION: Hematuria, slowly rising creatinine, assess for  vascular malformation  TECHNIQUE: Helical images were obtained through the abdomen before and following administration of IV contrast with postcontrast imaging obtained during the arterial phase. 3-D reconstruction of the vasculature was performed.   IV Contrast: Administered 77.0 ml of ISOVUE 370.00 mg/ml   Radiation dose: CTDI (mGy): DLP (mGy/cm) 10.00,7.90:289.40  FINDINGS:  Lower thorax: Unremarkable.  Hepatobiliary: Mild hepatomegaly with a right lobe craniocaudal span of 18 cm. No focal hepatic abnormality. There is no biliary dilation. The gallbladder is unremarkable.  Pancreas: Unremarkable.  Spleen: Mild splenomegaly with a craniocaudal span of 13 cm.   Adrenals: Normal.  Kidneys: No focal renal parenchymal abnormality. No evidence of mass, vascular malformation, or pseudoaneurysm. No hydronephrosis or proximal hydroureter.   GI Tract: No  bowel distention or wall thickening.  Lymph Nodes: No enlarged lymph nodes.  Vasculature: Nonaneurysmal abdominal aorta. No intimal dissection flap. The celiac trunk, SMA, bilateral renal arteries, and IMA are patent. The common iliac arteries and proximal aspects of the internal and external iliac arteries are patent bilaterally. Accessory renal arteries are present bilaterally.  Peritoneum/retroperitoneum: No free fluid or free air. No retroperitoneal hematoma.  Bones and soft tissues: No suspicious abnormality.  IMPRESSION:  1. Normal kidneys. No evidence of mass or vascular malformation.  2. Mild hepatosplenomegaly.  * * * F I N A L * * *  Dictated by: Jorene MinorsYamamoto, Alvin MD, Medical Doctor  Electronically signed by: Jorene MinorsYamamoto, Alvin MD  Transcribed by:MP , D: 12/22/2018 13:10, T: 12/22/2018 13:10, S: 12/22/2018 13:10  * * * F I N A L * * * 12/22/2018       CT Guided Ndl Plc Bx/Asp/Inj Exam: CT guided left renal biopsy 09/15/2018.  Clinical indication: Proteinuria.  Technique: Informed, written consent was given from the patient following complete description of the risks, benefits and alternatives to the procedure.  The patient was brought to the CT suite and placed in a prone position. Pre-procedure imaging was performed through the region of the kidneys. A site overlying the lower pole of the left kidney was marked. This site was then widely prepped and draped usual sterile fashion. 1% lidocaine local anesthetic was administered and with needle in place re-imaging was performed verifying location. Under CT guidance a 19 gauge introducer for the 18-gauge coring needle was advanced to the outer margin of the lower pole of the left kidney. With the pathologist present 3 core samples were acquired which were deemed sufficient. The stylet was then placed and the needle was removed. A bandage was placed at the puncture site.  0.15, 10.72, 10.84, 10.84, 10.84, 10.80, 10.99, 10.99, 10.99, 10.99, 11.03, 11.03: 1478  The  procedure was without complication and was very well tolerate by the patient. The patient left radiology department in stable condition without complaint.  Impression: Uncomplicated CT guided left kidney biopsy with 3 core samples acquired which were deemed sufficient by the pathologist. Procedure was very well tolerated by the patient.  * * * F I N A L * * *  Dictated by: Dolores HooseBatiste, Stanley M MD  Electronically signed by: Dolores HooseBatiste, Stanley M MD  Transcribed by:SP , D: 09/15/2018 15:53, T: 09/15/2018 15:53, S: 09/15/2018 15:54  * * * F I N A L * * *     CT Abdomen+Pelvis wo+w IV Con Reason For Exam  hematuria  Subject: CT urogram   Please order CT urogram for dx hematuria  Procedure: CT Abdomen+Pelvis wo+w IV Con  Exam: 07/11/2017 2:11 PM PST  Indication for  study: hematuria  RADIATION DOSE: CTDI Vol (mGy):DLP (mGy-cm) 7.45 7.30 742.62  Comparison: None.  IV Contrast: 125 mL of Isovue-370  Procedure: Axial 2.5 mm slices through the abdomen and pelvis were acquired without contrast. Contrast CT performed of the abdomen and pelvis utilizing 2 phase dose urogram protocol.Thin axial, coronal and sagittal and MIP reformats were created and reviewed.  FINDINGS:  Lack of fat slightly limits evaluation.  CT urogram:    Kidneys: No renal stones are present. No hydronephrosis, renal mass or delayed nephrograms. Normal excretion of contrast is present.  Ureters: No hydroureter.  Bladder: No bladder masses or stones.  Lungs:  The visualized portions of the lung bases are unremarkable.    Abdomen:  Organs:   The liver, gallbladder, bile ducts, spleen, kidneys, adrenal glands and pancreas are unremarkable.  Bowel: No bowel obstruction, colitis, acute appendicitis, diverticulitis or hiatal hernia. The appendix is normal in size seen on axial delayed 5:148. What is presumed nondistended bowel is seen in the left upper, left lower quadrant and retroperitoneum. This could less likely reflect adenopathy. Lack of peritoneal fat and timing  of contrast slightly limits evaluation.  Nodes: No significant pelvic or mesenteric adenopathy.   Mesentery:There is no pneumoperitoneum, free fluid or walled off fluid collection,  Vessels: The abdominal aorta does not appear dilated. There is no significant aortic calcification.    Pelvis:  Reproductive organs: Prostate is unremarkable.  Soft tissues: No anterior abdominal wall hernia or fluid collection.    Osseous structures: The visualized vertebral bodies are of normal height and the intervertebral joint spaces are maintained.   IMPRESSION:  1. Negative CT urogram to explain the hematuria. No renal calculi, hydroureter nephrosis, hydroureter or bladder stones.  * * * F I N A L * * *  Dictated by: Elton Sin DO, DO / NS -  Electronically signed by: Elton Sin D  Transcribed by:MP , D: 07/11/2017 15:14, T: 07/11/2017 15:14, S: 07/11/2017 15:14  * * * F I N A L * * *          REVIEW OF SYSTEMS      General: Negative fevers, Neg chills, Neg fatigue  Neuro: No headaches, Neg seizure  Psych: Neg depressed mood, insomnia, Neg anxiety  Resp: Neg cough, Neg shortness of breath  Cardio:Neg chest pain; Neg palpitations   GI: Neg abdominal pain, Neg nausea, Neg vomiting, Neg diarrhea  GU: Please see HPI   Musculoskeletal: No joint pain, Neg muscle ache  Integumentary: No complaints of new rash, Neg lumps     All other systems were reviewed and are negative        PHYSICAL EXAM      BP 129/78 (SITE: left arm, Orthostatic Position: sitting, Cuff Size: regular)   Pulse 72   Temp 36.8 C (98.2 F) (Temporal)   Resp 10   SpO2 98%         General exam:  General Appearance: alert, no distress  Psych- calm, pleasant affect, cooperative, speech is normal  Neck- Supple  Respiratory- The patient's effort is normal. No acute distress  Abd- non distended, soft, no tenderness  GU: Normal phallus and normal urethral meatus.  Bilateral epididymides are normal.    Bilateral testicles are normal volume nontender and  without palpable mass.  DRE: Normal sphincter tone,  Ext- no discoloration, no edema   Skin- warm, dry, no rashes, no lesions     ASSESSMENT AND PLAN      GROSS  HEMATURIA          In review, Alexander Hale is a very pleasant 23 year old male who was established with me during telemedicine visit on 02/12/2019 for a consult regarding recent gross hematuria.   Recent outside CTU and cystoscopy were negative.   He and his mother are requesting repeat of work-up as a second opinion.  He also had a recent negative renal biopsy.  He is without obstructive urinary symptoms and denied symptoms of an acute cystitis.    UA performed on 02/16/2019 confirmed that a urine culture was not indicated.  He had a normal uroflow and PVR today.     I recommended that he have a CBC to confirm his hemoglobin has as he has not had any recent labs and has frequent intermittent episodes of gross hematuria.  Results via MyChart.   Dayle is feeling well today.  Denied headache/dizziness.  Denied shortness of breath.  We discussed that a hematology work-up likely helpful which he will discuss with his PCP.    I did recommend that he follow-up with his PCP as soon as possible which he reported is dependent on his current professional support schedule.      FOLLOW-UP: As scheduled for routine cystoscopy in the [facility] urology clinic.  with me in the urology clinic as needed.        A total of 15  minutes were spent with patient, of which more than 50 percent of the face-to-face time was spent providing counseling or coordination of care on the above recommendations including: probable diagnosis, treatment options, alternatives, plan of care, expected outcomes, and return precautions. Patient verbalized understanding and is in agreement with plan of care. No perceived barriers to care.     Patient was wearing a surgical mask  Contact and Droplet precautions were followed when caring for the patient.   PPE used by provider during encounter: Face shield, Surgical  mask and Gloves    This note was created using the support of [employer] 10.1.  Please note any grammatical, sound-alike, or syntax errors as likely dictation errors.        Thank you for allowing me to participate in Alexander Hale's care.  If you have any questions or concerns, please do not hesitate to call.      Electronically Signed:    Lavada Mesi, FNP  Urology Clinic  PI# [phone]

## 2019-02-18 ENCOUNTER — Encounter: Payer: Self-pay | Admitting: Family

## 2019-02-18 ENCOUNTER — Telehealth: Payer: Self-pay | Admitting: Family

## 2019-02-18 NOTE — Telephone Encounter (Signed)
Patient called to advise of results of CBC that was performed on 02/17/2019.  Lab was requested for safety due to recent intermittent gross hematuria.  Hemoglobin noted and was reported to patient via phone.  Voice message left for patient to continue follow-up with his PCP.    Cecilie Kicks, NP

## 2019-02-19 ENCOUNTER — Encounter: Payer: Self-pay | Admitting: Family

## 2019-02-24 ENCOUNTER — Telehealth: Payer: Self-pay | Admitting: Family

## 2019-02-24 ENCOUNTER — Encounter: Payer: Self-pay | Admitting: Family

## 2019-02-24 NOTE — Telephone Encounter (Signed)
pts mother states she got an authorization approval from insurance for CT.  States the [place] # is [id number]. States patient is leaving town for a month and needs to get this done urgently   Please advise  Thanks

## 2019-03-30 NOTE — Progress Notes (Deleted)
PATIENT:  Alexander Hale, Alexander Hale   MR #:  [id number]   DOB:  1996/01/22  SEX:  male   AGE: 3yr   SERVICE DATE: 03/30/2019     UROLOGY CLINIC NOTE     REFERRING PHYSICIAN:   Zenda Alpers  [address] [place] [address]    Dear Zenda Alpers,     It was a pleasure to see Alexander Hale in the [facility]. The following is a report of *** visit today.    CC: Gross Hematuria***    HISTORY OF PRESENT ILLNESS:    As you recall, Alexander Hale is a 23yr-old male with a history of gross hematuria. Patient reported having gross hematuria in February of 2019. Since then patient has had intermittent hematuria with 1-2 episodes per week. Patient is currently followed by nephrologist Dr. Pecola Lawless. Patient recently had a negative renal biopsy.     Patient presents to clinic today for cystoscopy workup. Patient***    PAST MEDICAL HISTORY:  Past Medical History:   Diagnosis Date    Atopic dermatitis     Chronic sinusitis     Pityriasis     Sickle cell trait (HCC)        Past Surgical History:   Past Surgical History:   Procedure Laterality Date    NASAL POLYPECTOMY  2012    PR OPEN REPAIR WRIST DISLOCATION         Medications:   No current outpatient medications on file.     No current facility-administered medications for this visit.        Allergies:  No Known Allergies    SOCIAL HISTORY:  Social History     Socioeconomic History    Marital status: Not on file     Spouse name: Not on file    Number of children: Not on file    Years of education: Not on file    Highest education level: Not on file   Occupational History    Not on file   Social Needs    Financial resource strain: Not on file    Food insecurity     Worry: Not on file     Inability: Not on file    Transportation needs     Medical: Not on file     Non-medical: Not on file   Tobacco Use    Smoking status: Current Some Day Smoker     Types: Vapor Cigarettes    Smokeless tobacco: Never Used   Substance and Sexual Activity    Alcohol use: Yes      Frequency: Monthly or less    Drug use: Never    Sexual activity: Not on file   Lifestyle    Physical activity     Days per week: Not on file     Minutes per session: Not on file    Stress: Not on file   Relationships    Social connections     Talks on phone: Not on file     Gets together: Not on file     Attends religious service: Not on file     Active member of club or organization: Not on file     Attends meetings of clubs or organizations: Not on file     Relationship status: Not on file    Intimate partner violence     Fear of current or ex partner: Not on file     Emotionally abused: Not  on file     Physically abused: Not on file     Forced sexual activity: Not on file   Other Topics Concern    Not on file   Social History Narrative    Not on file       FAMILY HISTORY:  Review of patient's family history indicates:  Problem: Hypertension      Relation: Father          Age of Onset: (Not Specified)  Problem: Diabetes type 2      Relation: Mother          Age of Onset: (Not Specified)      REVIEW OF SYSTEMS  Constitutional: negative  Cardiovascular: negative  Respiratory: negative  GU: Please see HPI  All other systems reviewed are negative.    PHYSICAL EXAMINATION:    There were no vitals taken for this visit.      patient mask status:20356  COVID-19 precautions were followed when caring for the patient.    PPE used by provider during encounter:20355  General:  The patient is a well-nourished, well-developed male sitting comfortably in no acute distress.   HENT: Normocephalic, atraumatic.  Eyes:  Extraocular movements intact, pupils equally round and reactive to light. Conjunctiva and sclera was clear.  No nystagmus was present.  Neck: Soft, supple.  Cardiovascular: Regular rate and rhythm. ***  Respiratory: The patient's effort is normal. No acute distress.  Neurological: Alert and oriented. Moving all extremities. Normal gait. ***  Abdomen: Soft. Non-tender.   Skin:  The skin is warm and dry. No  rashes or lesions noted.  Psychiatric: Patient's mood and behavior are normal. Speech is normal.  GU: The penis is of normal size without lesion. The scrotum is normally formed with two descended testicles of normal size without mass or tenderness. There are no vasal or epididymal abnormalities noted. ***  GU: The labia, urethal meatus and vaginal opening are normal.***    IMAGING: The following radiological image(s) were personally reviewed by me today. The results were discussed with the patient.     02/17/2019 CT-scan of abdomen and pelvis: ***      ***    LABS: The following lab(s) were personally reviewed by me today. The results were discussed with the patient.     02/17/2019 CBC: ***    02/17/2019 Complex Uroflowmetry (02/17/2019)  Maximum Flow :          30.4 cc/sec  Average Flow :             12.6 cc/sec  Volume voided :     739 cc    02/16/2019 Cr: 1.19  02/16/2019 UA: RBC 439  ***    Pre-Procedure Checklist - Short Version  ID verified by two sources.  Site urethra and bladder  Procedure: cystoscopy  Surgical/Procedure pause: yes  Site(s) marked: N/A  Position: Supine  Consent: Available; reviewed by Blenda NicelyPaula Wagner, FNP.  History and Physical: Available; reviewed by Blenda NicelyPaula Wagner, FNP.   - Patient consent obtained: Yes,The alternatives, risks and benefits of the procedure were explained to the patient. Risks discussed including but not limited to discomfort, pain, bleeding, infection, injury to nearby structures, the need for urethral dilation and inability to perform the procedure. All questions were answered and the patient wished to proceed.      Procedure performed by: Blenda NicelyPaula Wagner, FNP    Cystourethroscopy  The patient was asked to void prior to the test. After verifying that the patient did not  have any symptoms of a urinary tract infection and confirming the urinalysis dipstick was negative for infection, the urethral meatus was cleansed with antiseptic. The patient was taken to the cystoscopy suite,  placed in the low lithotomy position and sterilely prepped in the usual fashion. The flexible cystourethroscope was then placed into the urethra and the urethra and bladder were gently filled with normal saline. The bladder was filled with normal saline visualizing the bladder and urethral structures well.     Cystoscopy Report    Pre operative Diagnosis : ***    Findings: ** **    Post Void Residual   Catheterized    ** ** ml.   Bladder:  Yes No      Normal appearing mucosa     Patchy Erythema     Masses     Stones     Foreign Bodies     Trabeculations     Hypermobile      Cystitis Cystica     Squamous Metaplasia     Diverticulae     Petechiae     Fistula     Signs of Interstitial Cystitis     Hunner's Ulcers     Glomerulations    Ureters    Bilateral Ureteral Jets     Appearance Normal     Position Normal     Anatomy normal    Urethra  Yes No      Erythema     Pseudopolyps     Masses     Diverticulae     Fistula     ASSESSMENT:    No diagnosis found. *** asymptomatic gross hematuria    In review, this is a 23yr-old male with history of asymptomatic gross hematuria***. Here today for cystoscopy, which ***. The patient tolerated the procedure well.***     RECOMMENDATIONS AND PLAN:  -    - Follow-up with [name], NP***    FOLLOW UP: Return to clinic in ***    Thank you for allowing me to participate in Frisco's care.  If you have any questions or concerns, please do not hesitate to call.    [place] interpreter used: No***     history review:10509   education:10548::"Barriers to Learning assessed: none. Patient verbalizes understanding of teaching and instructions."    I spent a total of *** minutes of face-to-face time with the patient; more than 50% was spent performing of the cystoscopy. We discussed the indications for treatment. The patient has agreed to the recommended treatment and suggestions.     SCRIBE DISCLAIMER:  This note was scribed by Nickolas Madrid, a trained medical scribe, in the presence of Josefine Class, NP.    Electronically Signed By Nickolas Madrid, Scribe, 03/30/2019 at 11:55 PM.    ** ** PWDISCLAIMER** **

## 2019-03-31 ENCOUNTER — Ambulatory Visit: Payer: No Typology Code available for payment source | Admitting: Nurse Practitioner

## 2019-04-21 ENCOUNTER — Ambulatory Visit: Payer: No Typology Code available for payment source | Admitting: Family

## 2019-04-21 ENCOUNTER — Encounter: Payer: Self-pay | Admitting: Family

## 2019-04-21 DIAGNOSIS — R310 Gross hematuria: Secondary | ICD-10-CM

## 2019-04-21 NOTE — Patient Instructions (Signed)
Alexander Hale it was a pleasure seeing you for a Video Visit today!      If you have any questions or concerns regarding your visit today please do not hesitate to call or communicate on MyChart.     [name]  Nurse Practitioner    [facility]   [phone]

## 2019-04-21 NOTE — Progress Notes (Signed)
Office Visit     04/21/2019  Urology Clinic       Alexander Mesi, NP     UROLOGY CLINIC 04/21/2019      Alexander Hale  DOB: [birth year]  MRN: [id number]  Date of Encounter: 04/21/2019         REFERRING PROVIDER: Erick Alley Hsueh-Li*  PCP: Alexander Hale     Progress Notes     Expand All Collapse All           UROLOGY CLINIC   VIDEO VISIT     I performed this clinical encounter by utilizing a real-time telehealth video connection between my location, which is on campus in the [facility] urology clinic, and the patient's location, which is home. The patient's location was confirmed during this visit. Verbal consent was obtained from the patient to perform this clinical encounter utilizing video and any questions from the patient regarding the telehealth interaction were answered.    If you are reviewing this clinic note and have questions about the meaning or Hale terminology being used, please bring it up at your next follow up appointment. Hale notes are meant to be a communication tool between Hale professionals and require Hale terminology to be used for efficiency.      CHIEF COMPLAINT           Chief Complaint   Patient presents today with c/o blood in urine.         HISTORY OF PRESENT ILLNESS     History was obtained from the patient     Alexander Hale is a 23 year old male who presents today for follow-up regarding gross hematuria.  He presents for a telemedicine discussion of recent CT results, copy scanned to media.  Alexander Hale has been feeling well. No dysuria. + Gross hematuria described as a nonpainful pink/red tinge with each urination.  Denied abdominal/flank pain.  Denied penile/testicular complaints.  All other systems were reviewed and are negative          PAST Hale HISTORY  & PAST SURGICAL HISTORY      There is no problem list on file for this patient.    Past Hale History:   Diagnosis Date    Atopic dermatitis     Chronic sinusitis     Pityriasis     Sickle cell trait (HCC)      Past Surgical  History:   Procedure Laterality Date    NASAL POLYPECTOMY  2012    PR OPEN REPAIR WRIST DISLOCATION             LABS/IMAGING/STUDIES     I have personally reviewed recent results in the Hale record.   The following results were discussed with the patient:     See copy of CTU and scanned media.       PHYSICAL EXAM       General Appearance: healthy, alert, no distress  Psych- calm, pleasant affect, cooperative, speech is normal  Respiratory- The patient's effort is normal. No visible distress   Skin- no pallor     ASSESSMENT AND PLAN       GROSS HEMATURIA                 In review, Alexander Hale is a 23 y.o. male who was established with me during consult regarding Gross Hematuria on 02/12/2019 which he reported was for a second opinion. Had a normal CTU at an facility in March 2019 prior to having a routine cystoscopy by an outside urologist.  He saw a nephrologist   Dr. Posey Hale who performed a renal biopsy on Sep 15, 2018.   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  He does not have a history of urethral stricture and currently does not have any obstructive signs.  He     .He had a repeat CTU performed in 03/2019, which was reviewed and discussed with Alexander Hale today.   CTU confirmed no abnormality to explain his gross hematuria.   He would like to proceed with a routine cystoscopy to complete his gross hematuria workup and for a second opinion since he has had a non-painful pink/red tinged urine daily in the past month.       FOLLOW-UP:   Next available  Cystoscopy to complete gross hematuria workup            Approximately 20 minutes were spent with the patient, of which more than 50% of the time was spent in counseling and/or coordinating care regarding gross hematuria..  The patient verbalized understanding and agreed with the plan of care as outlined.     This note was created using the support of Alexander Hale 10.1.  Please note any  grammatical, sound-alike, or syntax errors as likely dictation errors.        Thank you for allowing me to participate in [name] care.  If you have any questions or concerns, please do not hesitate to call.      Electronically Signed:    Cecilie Hale, Alexander Hale  Urology Clinic  PI# [id number]

## 2019-05-03 ENCOUNTER — Telehealth: Payer: Self-pay | Admitting: Family

## 2019-05-03 NOTE — Telephone Encounter (Signed)
Pt is requesting a call back, would like to discuss possible ureteroscopy and is wondering if that would be an option for him? States his doctor at [facility] had mentioned that procedure to him before. He is scheduled for a cysto on 2/8.

## 2019-05-07 ENCOUNTER — Encounter: Payer: Self-pay | Admitting: Family

## 2019-05-11 ENCOUNTER — Telehealth: Payer: Self-pay | Admitting: Family

## 2019-05-11 ENCOUNTER — Encounter: Payer: Self-pay | Admitting: Family

## 2019-05-11 NOTE — Telephone Encounter (Signed)
Patient called regarding MyChart message.   No response.   Plan: He will be seen as scheduled for Cystoscopy in February 2021.   No further action required at this time.

## 2019-06-14 ENCOUNTER — Ambulatory Visit: Payer: No Typology Code available for payment source | Admitting: Nurse Practitioner

## 2019-07-14 ENCOUNTER — Ambulatory Visit: Payer: No Typology Code available for payment source | Admitting: Nurse Practitioner

## 2019-07-14 NOTE — Progress Notes (Deleted)
PATIENT:  Alexander, Hale   MR #:  [id number]   DOB:  03/02/1996  SEX:  male   AGE: 30yr   SERVICE DATE: 07/14/2019     UROLOGY CLINIC NOTE     REFERRING PHYSICIAN:   Zenda Alpers  [address] [place] [address]    Dear Zenda Alpers,     It was a pleasure to see Alexander Hale in the [facility]. The following is a report of his*** visit today.    CC: *** Gross hematuria    HISTORY OF PRESENT ILLNESS:    As you recall, Alexander Hale is a 24yr-old male with a history of gross hematuria. During his last visit with my colleague Jeremy Johann, NP patient endorsed gross hematuria without flank pain, dyruia or testicular complaints. Of note patient had a pink/red tinge during urination.     Today patient reports to clinic for cystoscopy to complete gross hematuria work-up. ***    PAST MEDICAL HISTORY:  Past Medical History:   Diagnosis Date    Atopic dermatitis     Chronic sinusitis     Pityriasis     Sickle cell trait (HCC)        Past Surgical History:   Past Surgical History:   Procedure Laterality Date    NASAL POLYPECTOMY  2012    PR OPEN REPAIR WRIST DISLOCATION         Medications:   No current outpatient medications on file.     No current facility-administered medications for this visit.       Allergies:  No Known Allergies    SOCIAL HISTORY:  Social History     Socioeconomic History    Marital status: UNKNOWN     Spouse name: Not on file    Number of children: Not on file    Years of education: Not on file    Highest education level: Not on file   Occupational History    Not on file   Tobacco Use    Smoking status: Current Some Day Smoker     Types: Vapor Cigarettes    Smokeless tobacco: Never Used   Substance and Sexual Activity    Alcohol use: Yes    Drug use: Never    Sexual activity: Not on file   Other Topics Concern    Not on file   Social History Narrative    Not on file     Social Determinants of Health     Financial Resource Strain:     Difficulty of Paying Living Expenses: Not  on file   Food Insecurity:     Worried About Running Out of Food in the Last Year: Not on file    Ran Out of Food in the Last Year: Not on file   Transportation Needs:     Lack of Transportation (Medical): Not on file    Lack of Transportation (Non-Medical): Not on file   Physical Activity:     Days of Exercise per Week: Not on file    Minutes of Exercise per Session: Not on file   Stress:     Feeling of Stress : Not on file   Social Connections:     Frequency of Communication with Friends and Family: Not on file    Frequency of Social Gatherings with Friends and Family: Not on file    Attends Religious Services: Not on file    Active Member of Clubs or Organizations: Not  on file    Attends Club or Organization Meetings: Not on file    Marital Status: Not on file   Intimate Partner Violence:     Fear of Current or Ex-Partner: Not on file    Emotionally Abused: Not on file    Physically Abused: Not on file    Sexually Abused: Not on file       FAMILY HISTORY:  Review of patient's family history indicates:  Problem: Hypertension      Relation: Father          Age of Onset: (Not Specified)  Problem: Diabetes type 2      Relation: Mother          Age of Onset: (Not Specified)      REVIEW OF SYSTEMS  Constitutional: negative  Cardiovascular: negative  Respiratory: negative  GU: Please see HPI  All other systems reviewed are negative.    PHYSICAL EXAMINATION:    There were no vitals taken for this visit.      patient mask status:20356  COVID-19 precautions were followed when caring for the patient.    PPE used by provider during encounter:20355  General:  The patient is a well-nourished, well-developed male sitting comfortably in no acute distress.   HENT: Normocephalic, atraumatic.  Eyes:  Extraocular movements intact, pupils equally round and reactive to light. Conjunctiva and sclera was clear.  No nystagmus was present.  Neck: Soft, supple.  Cardiovascular: Regular rate and rhythm. ***  Respiratory:  The patient's effort is normal. No acute distress.  Neurological: Alert and oriented. Moving all extremities. Normal gait. ***  Abdomen: Soft. Non-tender.   Skin:  The skin is warm and dry. No rashes or lesions noted.  Psychiatric: Patient's mood and behavior are normal. Speech is normal.  GU: The penis is of normal size without lesion. The scrotum is normally formed with two descended testicles of normal size without mass or tenderness. There are no vasal or epididymal abnormalities noted. ***  GU: The labia, urethal meatus and vaginal opening are normal.***    IMAGING: The following radiological image(s) were personally reviewed by me today. The results were discussed with the patient.     ***    LABS: The following lab(s) were personally reviewed by me today. The results were discussed with the patient.     ***    Pre-Procedure Checklist - Short Version  ID verified by two sources.  Site urethra and bladder  Procedure: cystoscopy  Surgical/Procedure pause: yes  Site(s) marked: N/A  Position: Supine  Consent: Available; reviewed by Josefine Class, FNP.  History and Physical: Available; reviewed by Josefine Class, FNP.   - Patient consent obtained: Yes,The alternatives, risks and benefits of the procedure were explained to the patient. Risks discussed including but not limited to discomfort, pain, bleeding, infection, injury to nearby structures, the need for urethral dilation and inability to perform the procedure. All questions were answered and the patient wished to proceed.      Procedure performed by: Josefine Class, FNP    Cystourethroscopy  The patient was asked to void prior to the test. After verifying that the patient did not have any symptoms of a urinary tract infection and confirming the urinalysis dipstick was negative for infection, the urethral meatus was cleansed with antiseptic. The patient was taken to the cystoscopy suite, placed in the low lithotomy position and sterilely prepped in the usual  fashion. The flexible cystourethroscope was then placed into the urethra and the urethra  and bladder were gently filled with normal saline. The bladder was filled with normal saline visualizing the bladder and urethral structures well.     Cystoscopy Report    Pre operative Diagnosis : *** Gross hematuria    Findings: ** **     Post Void Residual   Catheterized    ** ** ml.   Bladder:  Yes No      Normal appearing mucosa     Patchy Erythema     Masses     Stones     Foreign Bodies     Trabeculations     Hypermobile      Cystitis Cystica     Squamous Metaplasia     Diverticulae     Petechiae     Fistula     Signs of Interstitial Cystitis     Hunner's Ulcers     Glomerulations    Ureters    Bilateral Ureteral Jets     Appearance Normal     Position Normal     Anatomy normal    Urethra  Yes No      Erythema     Pseudopolyps     Masses     Diverticulae     Fistula     ASSESSMENT:    No diagnosis found.    In review, this is a 24yr-old male with history of ***. Here today for cystoscopy, which ***. The patient tolerated the procedure well.     RECOMMENDATIONS AND PLAN:   - ***    FOLLOW UP: Return to clinic in ***    Thank you for allowing me to participate in Tanay's care.  If you have any questions or concerns, please do not hesitate to call.    [place] interpreter used: No***     history review:10509   education:10548::"Barriers to Learning assessed: none. Patient verbalizes understanding of teaching and instructions."    I spent a total of *** minutes of face-to-face time with the patient; more than 50% was spent performing of the cystoscopy. We discussed the indications for treatment. The patient has agreed to the recommended treatment and suggestions.     SCRIBE DISCLAIMER:  This note was scribed by Verdis Frederickson, a trained medical scribe, in the presence of Blenda Nicely, NP.    Electronically Signed By Verdis Frederickson, Scribe, 07/14/2019 at 8:59 AM.    ** ** PWDISCLAIMER** **

## 2019-07-15 ENCOUNTER — Encounter: Payer: Self-pay | Admitting: Nurse Practitioner

## 2019-07-22 IMAGING — CT CT WRIST*R* W/O CM
2 series · 15 of 20 positions shown, 18 images · non-contrast
Comparison: None.

CLINICAL DATA: History of scaphoid fracture. Never had any
treatment. Right wrist pain.

EXAM:
CT OF THE RIGHT WRIST WITHOUT CONTRAST
TECHNIQUE: Multidetector CT imaging of the right wrist was performed according
to the standard protocol. Multiplanar CT image reconstructions were
also generated.

[Series 6: cor st · coronal · 0.24mm/px · 3 of 51 slices shown]
[im 11/51  bone]
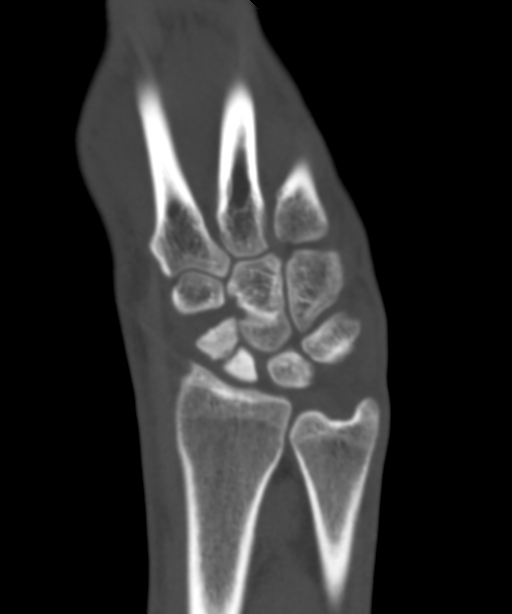
[im 21/51  bone]
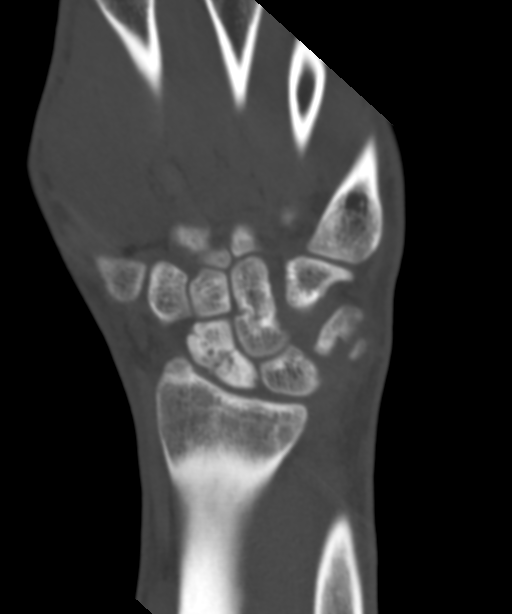
[im 31/51  bone]
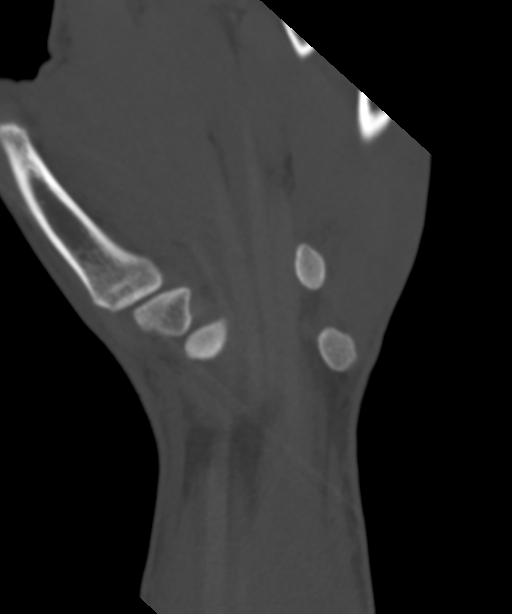

[Series 602: <mpr range> · axial · 0.33mm/px · z∈[-407,-383]mm · 12 of 30 slices shown, 15 images]
[im 3/30  soft-tissue]
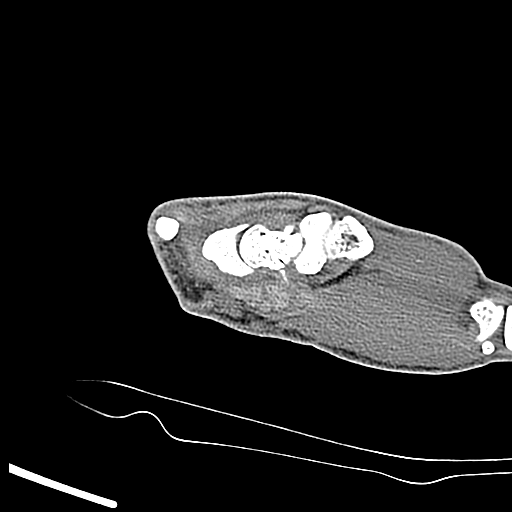
[im 3/30  bone]
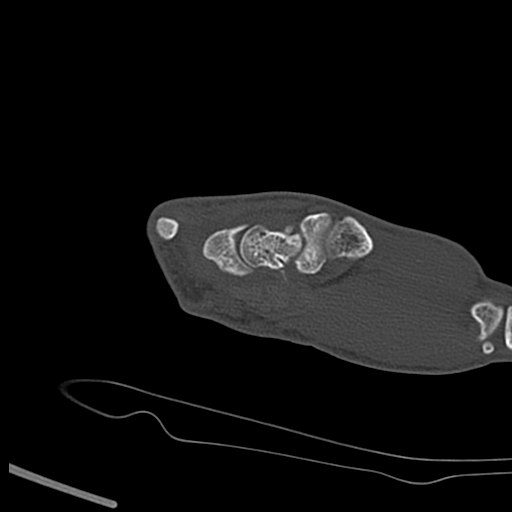
[im 5/30  bone]
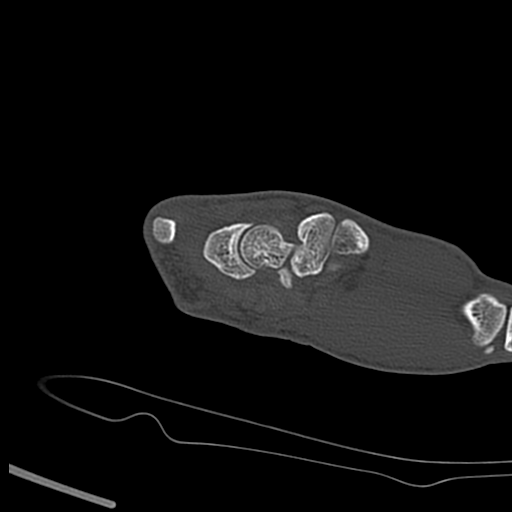
[im 7/30  bone]
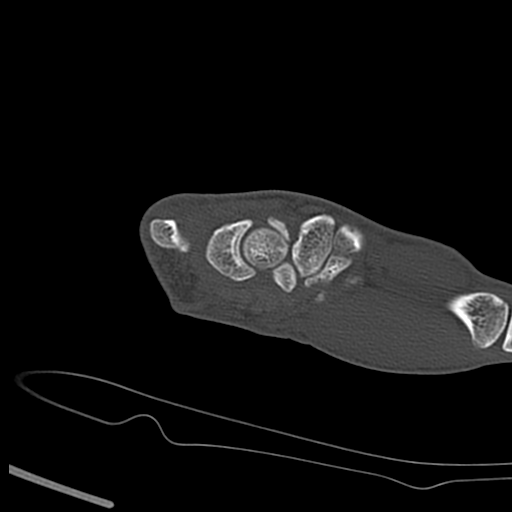
[im 9/30  bone]
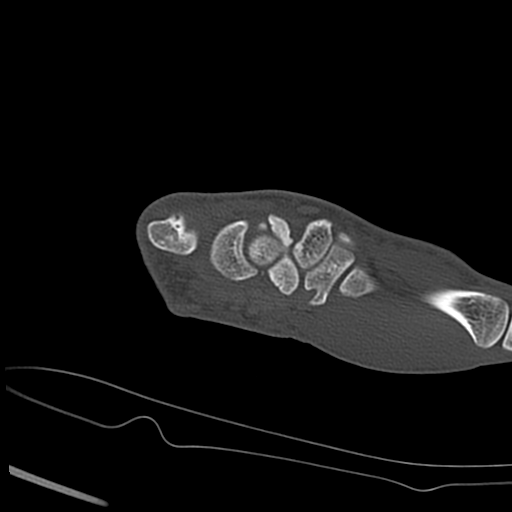
[im 12/30  soft-tissue]
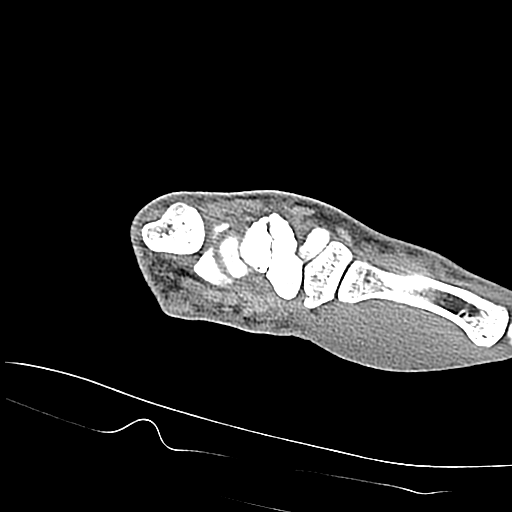
[im 12/30  bone]
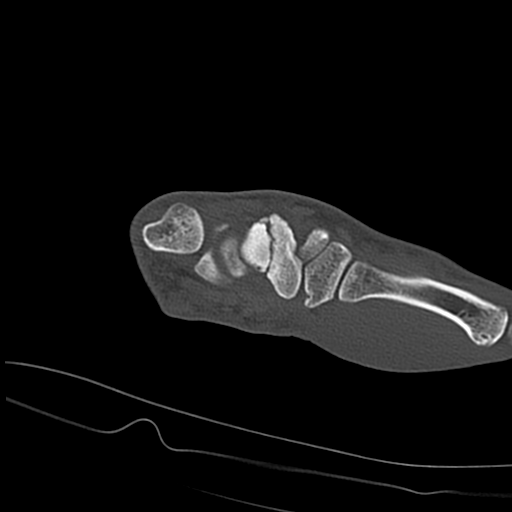
[im 14/30  bone]
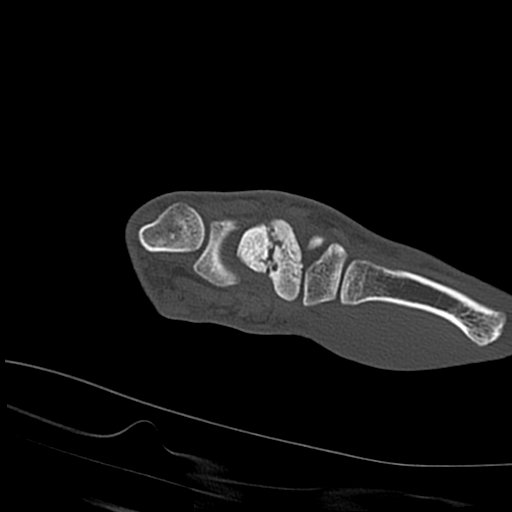
[im 16/30  bone]
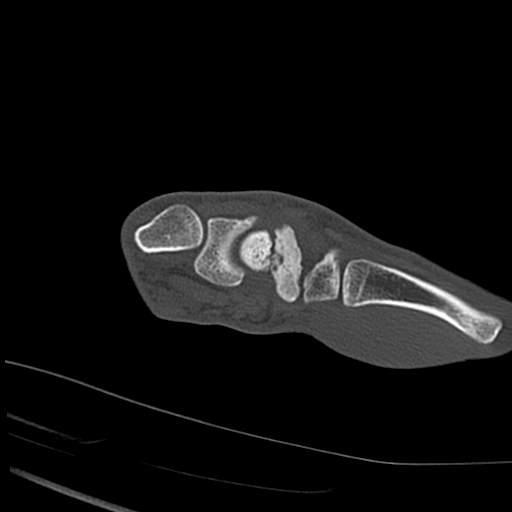
[im 18/30  bone]
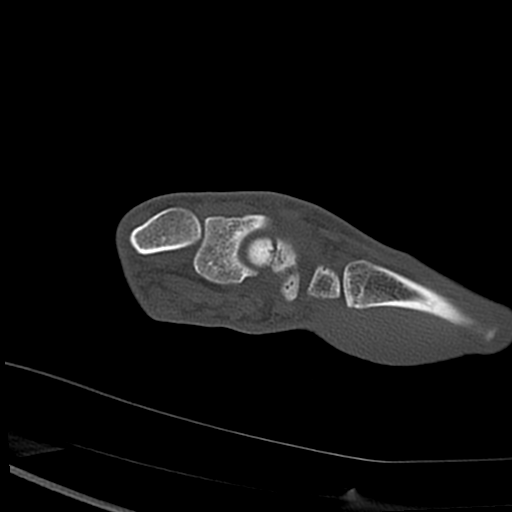
[im 21/30  soft-tissue]
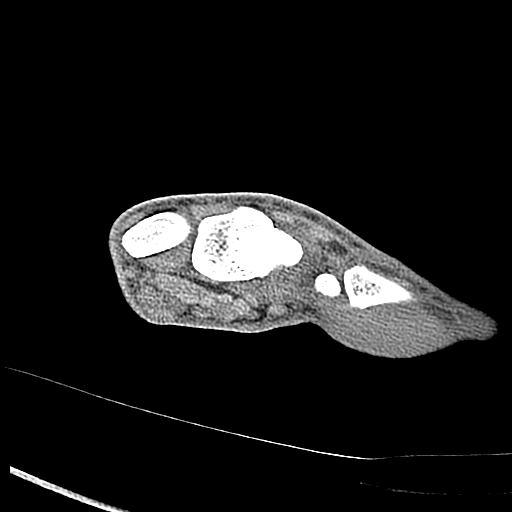
[im 21/30  bone]
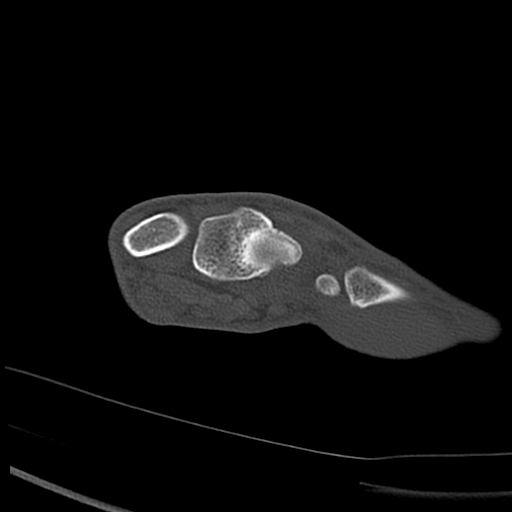
[im 23/30  bone]
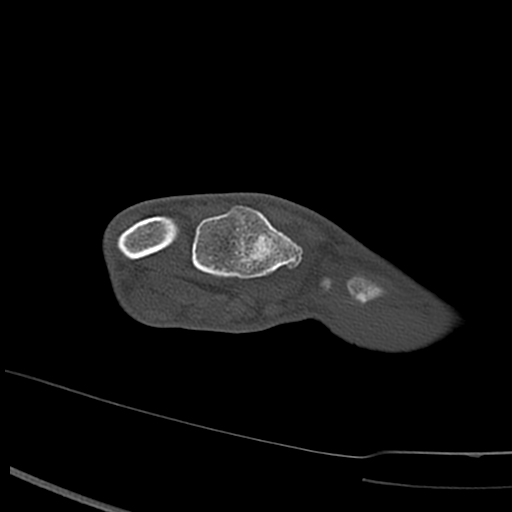
[im 25/30  bone]
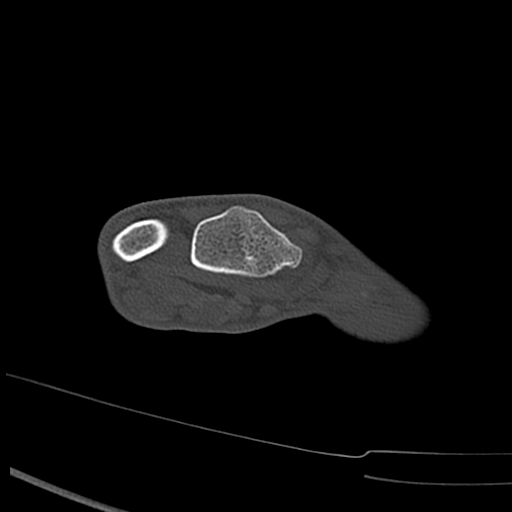
[im 27/30  bone]
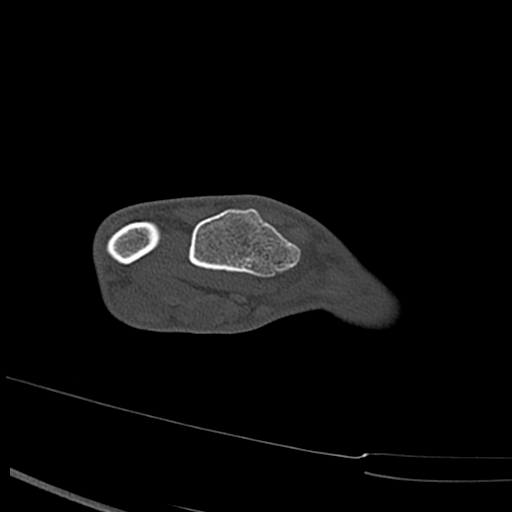

[15 of 20 positions shown; findings below may reference images not displayed]

FINDINGS: Bones/Joint/Cartilage

Ununited fracture of waist of the scaphoid. Cystic changes on either
side of the fracture cleft. Increased density of the proximal pole
of the scaphoid consistent with avascular necrosis.

No other acute fracture or dislocation. No aggressive osseous
lesion. Joint spaces are maintained. No erosive changes. Normal
alignment.

Ligaments

Suboptimally assessed by CT.

Muscles and Tendons

Muscles are normal. No muscle atrophy. Flexor and extensor
compartment tendons are grossly intact.

Soft tissues

No fluid collection or hematoma.
IMPRESSION: 1. Ununited fracture of waist of the scaphoid with cystic changes on
either side of the fracture cleft. Avascular necrosis of the
proximal pole of the scaphoid.

## 2022-09-14 ENCOUNTER — Emergency Department: Admit: 2022-09-15 | Payer: PRIVATE HEALTH INSURANCE

## 2022-09-14 DIAGNOSIS — S01112A Laceration without foreign body of left eyelid and periocular area, initial encounter: Secondary | ICD-10-CM

## 2022-09-14 NOTE — ED Provider Notes (Signed)
[facility] ED  eMERGENCY dEPARTMENT eNCOUnter      Pt Name: Steve Johnson  MRN: [id number]  Birthdate [birth year]  Date of evaluation: 09/14/2022  Provider: Cecille Aver, PA    My attending is Dr. Leavy Cella    HISTORY OF PRESENT ILLNESS    Steve Johnson is a 27 y.o. male with PMHx of none presents to the emergency department with left eyebrow laceration. PTA patient was sliding into 3rd base when the baseman's knee hit his left eye.  No loss of consciousness, no blood thinner use, no headaches.  He has minimal left-sided neck pain.  No confusion, nausea, vomiting.  He denies back pain, chest pain, shortness of breath, abdominal pain, nausea, vomiting.     HPI    Nursing Notes were reviewed.    REVIEW OF SYSTEMS       Review of Systems   Constitutional:  Negative for appetite change, chills and fever.   HENT:  Negative for congestion, rhinorrhea and sore throat.    Respiratory:  Negative for cough and shortness of breath.    Cardiovascular:  Negative for chest pain.   Gastrointestinal:  Negative for abdominal pain, blood in stool, diarrhea, nausea and vomiting.   Genitourinary:  Negative for difficulty urinating.   Musculoskeletal:  Positive for neck pain. Negative for neck stiffness.   Skin:  Positive for wound. Negative for color change and rash.   Neurological:  Negative for dizziness, syncope, weakness, light-headedness, numbness and headaches.   All other systems reviewed and are negative.            PAST MEDICAL HISTORY   No past medical history on file.      SURGICAL HISTORY     No past surgical history on file.      CURRENT MEDICATIONS       There are no discharge medications for this patient.      ALLERGIES     Patient has no known allergies.    FAMILY HISTORY     No family history on file.       SOCIAL HISTORY       Social History     Socioeconomic History    Marital status: Single         PHYSICAL EXAM         ED Triage Vitals [09/14/22 2101]   BP Temp Temp src Pulse Respirations SpO2 Height Weight -  Scale   104/71 97.6 F (36.4 C) -- 77 18 97 % 1.956 m (6\' 5" ) 90.7 kg (200 lb)       Physical Exam  Constitutional:       Appearance: He is well-developed.   HENT:      Head: Normocephalic.      Comments: 2cm minimally gaping laceration to lateral aspect of left eyebrow, no active bleeding.  No raccoon eyes or Battle sign, no hemotympanums     Right Ear: Tympanic membrane normal.      Left Ear: Tympanic membrane normal.   Eyes:      Conjunctiva/sclera: Conjunctivae normal.      Pupils: Pupils are equal, round, and reactive to light.   Neck:      Trachea: No tracheal deviation.      Comments: No midline C, T, L spinous process tenderness, no paraspinal muscle tenderness, no signs of trauma.   Cardiovascular:      Heart sounds: Normal heart sounds.   Pulmonary:      Effort: Pulmonary effort is normal. No  respiratory distress.      Breath sounds: Normal breath sounds. No stridor.   Abdominal:      General: Bowel sounds are normal. There is no distension.      Palpations: Abdomen is soft. There is no mass.      Tenderness: There is no abdominal tenderness. There is no guarding or rebound.   Musculoskeletal:         General: Normal range of motion.      Cervical back: Normal range of motion and neck supple. No tenderness.   Skin:     General: Skin is warm and dry.      Capillary Refill: Capillary refill takes less than 2 seconds.      Findings: No rash.   Neurological:      Mental Status: He is alert and oriented to person, place, and time.      Deep Tendon Reflexes: Reflexes are normal and symmetric.   Psychiatric:         Behavior: Behavior normal.         Thought Content: Thought content normal.         Judgment: Judgment normal.         DIAGNOSTIC RESULTS     EKG:All EKG's are interpreted by the Emergency Department Physician who either signs or Co-signs this chart in the absence of a cardiologist.        RADIOLOGY:   Non-plain film images such as CT, Ultrasound and MRI are read by theradiologist. Plain radiographic  images are visualized and preliminarily interpreted by the emergency physician with the below findings:    Interpretation per theRadiologist below, if available at the time of this note:    XR ORBITS (MIN 4 VIEWS)   Final Result   No sign of facial or calvarial fracture. Specifically, there is no sign of   fracture of the left orbital rim.                 LABS:  Labs Reviewed - No data to display    All other labs were within normal range or not returned as of this dictation.    EMERGENCY DEPARTMENT COURSE and DIFFERENTIAL DIAGNOSIS/MDM:   Vitals:    Vitals:    09/14/22 2101   BP: 104/71   Pulse: 77   Resp: 18   Temp: 97.6 F (36.4 C)   SpO2: 97%   Weight: 90.7 kg (200 lb)   Height: 1.956 m (6\' 5" )         MDM    Patient presents with left eyebrow laceration after injury    Radiologist interpreting:  Orbit x-ray shows no acute fractures    Area cleaned with Hibiclens and water.  Dermabond placed to close wound.  Given Motrin for pain.  Per PECARN criteria patient does not qualify for CAT scan of the head.  No neurological deficits, headaches, blood thinner use.  Patient aware to apply ice to the area, alternate Motrin Tylenol for pain.  Sent home with ice bag.  Aware that bruising is most likely to occur.     Standard anticipatory guidance given to patient upon discharge. Have given them a specific time frame in which to follow-up and who to follow-up with. I have also advised them that they should return to the emergency department if they get worse, or not getting better or develop any new or concerning symptoms. Patient demonstrates understanding.        CRITICAL CARE TIME   Total  Critical Caretime was 0 minutes, excluding separately reportable procedures.  There was a high probability of clinically significant/life threatening deterioration in the patient's condition which required my urgent intervention.         Lac Repair    Date/Time: 09/14/2022 9:50 PM    Performed by: Cecille Aver, PA  Authorized by:  Cecille Aver, PA    Consent:     Consent obtained:  Verbal    Consent given by:  Patient    Risks, benefits, and alternatives were discussed: yes      Risks discussed:  Infection, need for additional repair, poor wound healing, poor cosmetic result, pain and nerve damage  Universal protocol:     Patient identity confirmed:  Verbally with patient  Anesthesia:     Anesthesia method:  None  Laceration details:     Location:  Face    Face location:  L eyebrow    Length (cm):  2  Pre-procedure details:     Preparation:  Patient was prepped and draped in usual sterile fashion and imaging obtained to evaluate for foreign bodies  Exploration:     Limited defect created (wound extended): no      Wound exploration: wound explored through full range of motion and entire depth of wound visualized      Wound extent: no areolar tissue violation noted, no fascia violation noted, no foreign bodies/material noted, no muscle damage noted, no nerve damage noted, no tendon damage noted, no underlying fracture noted and no vascular damage noted      Contaminated: no    Treatment:     Area cleansed with:  Chlorhexidine    Amount of cleaning:  Standard  Skin repair:     Repair method:  Tissue adhesive  Approximation:     Approximation:  Close  Repair type:     Repair type:  Simple  Post-procedure details:     Dressing:  Open (no dressing)    Procedure completion:  Tolerated      FINAL IMPRESSION      1. Eyebrow laceration, left, initial encounter          DISPOSITION/PLAN   DISPOSITION Decision To Discharge 09/14/2022 10:49:02 PM      PATIENT REFERRED TO:  [facility]  [address] [place] [address]  [phone]          DISCHARGE MEDICATIONS:  There are no discharge medications for this patient.         (Please notethat portions of this note were completed with a voice recognition program.  Efforts were made to edit the dictations but occasionally words are mis-transcribed.)    Cecille Aver, PA  (electronically signed)  Emergency Physician Assistant         Cecille Aver, [place]  09/14/22 2314

## 2022-09-14 NOTE — ED Triage Notes (Signed)
Was playing baseball slid into another players knee laceration above left eyebrow, denies any thinners, denies any, LOC

## 2022-09-15 ENCOUNTER — Inpatient Hospital Stay
Admit: 2022-09-15 | Discharge: 2022-09-15 | Disposition: A | Discharge status: Home / Self Care | Payer: PRIVATE HEALTH INSURANCE

## 2022-09-15 MED ORDER — IBUPROFEN 800 MG PO TABS
800 | Freq: Once | ORAL | Status: AC
Start: 2022-09-15 — End: 2022-09-14
  Administered 2022-09-15: 03:00:00 800 mg via ORAL

## 2022-09-15 MED FILL — IBUPROFEN 800 MG PO TABS: 800 MG | ORAL | Qty: 1
